# Patient Record
Sex: Female | Born: 1950 | Race: Black or African American | Hispanic: No | Marital: Married | Smoking: Never smoker
Health system: Southern US, Community
[De-identification: ages and names within clinical notes are randomized; demographics above are authoritative.]

## PROBLEM LIST (undated history)

## (undated) DIAGNOSIS — E559 Vitamin D deficiency, unspecified: Secondary | ICD-10-CM

## (undated) DIAGNOSIS — E785 Hyperlipidemia, unspecified: Secondary | ICD-10-CM

## (undated) DIAGNOSIS — M8580 Other specified disorders of bone density and structure, unspecified site: Secondary | ICD-10-CM

## (undated) DIAGNOSIS — I10 Essential (primary) hypertension: Secondary | ICD-10-CM

## (undated) DIAGNOSIS — J302 Other seasonal allergic rhinitis: Secondary | ICD-10-CM

## (undated) DIAGNOSIS — R05 Cough: Secondary | ICD-10-CM

## (undated) HISTORY — DX: Vitamin D deficiency, unspecified: E55.9

## (undated) HISTORY — PX: BREAST BIOPSY: SHX20

## (undated) HISTORY — PX: TUBAL LIGATION: SHX77

## (undated) HISTORY — DX: Hyperlipidemia, unspecified: E78.5

## (undated) HISTORY — DX: Essential (primary) hypertension: I10

## (undated) HISTORY — DX: Other specified disorders of bone density and structure, unspecified site: M85.80

## (undated) HISTORY — PX: ABDOMINAL HYSTERECTOMY: SHX81

## (undated) HISTORY — DX: Other seasonal allergic rhinitis: J30.2

---
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
## 1997-05-28 ENCOUNTER — Ambulatory Visit (HOSPITAL_COMMUNITY): Admission: RE | Admit: 1997-05-28 | Discharge: 1997-05-28 | Payer: Self-pay | Admitting: Internal Medicine

## 1997-09-12 ENCOUNTER — Ambulatory Visit (HOSPITAL_COMMUNITY): Admission: RE | Admit: 1997-09-12 | Discharge: 1997-09-12 | Payer: Self-pay | Admitting: Gastroenterology

## 1997-11-11 ENCOUNTER — Inpatient Hospital Stay (HOSPITAL_COMMUNITY): Admission: RE | Admit: 1997-11-11 | Discharge: 1997-11-12 | Payer: Self-pay | Admitting: Obstetrics and Gynecology

## 1998-08-22 ENCOUNTER — Encounter: Payer: Self-pay | Admitting: Obstetrics and Gynecology

## 1998-08-22 ENCOUNTER — Ambulatory Visit (HOSPITAL_COMMUNITY): Admission: RE | Admit: 1998-08-22 | Discharge: 1998-08-22 | Payer: Self-pay | Admitting: Obstetrics and Gynecology

## 1998-08-25 ENCOUNTER — Ambulatory Visit (HOSPITAL_COMMUNITY): Admission: RE | Admit: 1998-08-25 | Discharge: 1998-08-25 | Payer: Self-pay | Admitting: Obstetrics and Gynecology

## 1999-04-20 ENCOUNTER — Other Ambulatory Visit: Admission: RE | Admit: 1999-04-20 | Discharge: 1999-04-20 | Payer: Self-pay | Admitting: Obstetrics and Gynecology

## 1999-09-04 ENCOUNTER — Encounter: Payer: Self-pay | Admitting: Family Medicine

## 1999-09-04 ENCOUNTER — Encounter: Admission: RE | Admit: 1999-09-04 | Discharge: 1999-09-04 | Payer: Self-pay | Admitting: Family Medicine

## 1999-09-07 ENCOUNTER — Encounter: Payer: Self-pay | Admitting: Obstetrics and Gynecology

## 1999-09-07 ENCOUNTER — Ambulatory Visit (HOSPITAL_COMMUNITY): Admission: RE | Admit: 1999-09-07 | Discharge: 1999-09-07 | Payer: Self-pay | Admitting: Obstetrics and Gynecology

## 1999-09-11 ENCOUNTER — Encounter: Admission: RE | Admit: 1999-09-11 | Discharge: 1999-09-11 | Payer: Self-pay | Admitting: Obstetrics and Gynecology

## 1999-09-11 ENCOUNTER — Encounter: Payer: Self-pay | Admitting: Obstetrics and Gynecology

## 2000-05-03 ENCOUNTER — Ambulatory Visit (HOSPITAL_COMMUNITY): Admission: RE | Admit: 2000-05-03 | Discharge: 2000-05-03 | Payer: Self-pay | Admitting: Family Medicine

## 2000-05-03 ENCOUNTER — Encounter: Payer: Self-pay | Admitting: Family Medicine

## 2000-07-27 ENCOUNTER — Other Ambulatory Visit: Admission: RE | Admit: 2000-07-27 | Discharge: 2000-07-27 | Payer: Self-pay | Admitting: Obstetrics and Gynecology

## 2000-10-04 ENCOUNTER — Ambulatory Visit (HOSPITAL_COMMUNITY): Admission: RE | Admit: 2000-10-04 | Discharge: 2000-10-04 | Payer: Self-pay | Admitting: Obstetrics and Gynecology

## 2000-10-04 ENCOUNTER — Encounter: Payer: Self-pay | Admitting: Obstetrics and Gynecology

## 2002-02-08 HISTORY — PX: COLONOSCOPY: SHX174

## 2002-02-12 ENCOUNTER — Ambulatory Visit (HOSPITAL_COMMUNITY): Admission: RE | Admit: 2002-02-12 | Discharge: 2002-02-12 | Payer: Self-pay | Admitting: Gastroenterology

## 2002-10-30 ENCOUNTER — Ambulatory Visit (HOSPITAL_COMMUNITY): Admission: RE | Admit: 2002-10-30 | Discharge: 2002-10-30 | Payer: Self-pay | Admitting: Obstetrics and Gynecology

## 2002-10-30 ENCOUNTER — Encounter: Payer: Self-pay | Admitting: Obstetrics and Gynecology

## 2003-09-12 ENCOUNTER — Encounter: Admission: RE | Admit: 2003-09-12 | Discharge: 2003-09-12 | Payer: Self-pay | Admitting: Obstetrics and Gynecology

## 2004-03-19 ENCOUNTER — Other Ambulatory Visit: Admission: RE | Admit: 2004-03-19 | Discharge: 2004-03-19 | Payer: Self-pay | Admitting: Obstetrics and Gynecology

## 2005-05-19 ENCOUNTER — Ambulatory Visit (HOSPITAL_COMMUNITY): Admission: RE | Admit: 2005-05-19 | Discharge: 2005-05-19 | Payer: Self-pay | Admitting: Obstetrics and Gynecology

## 2005-12-29 ENCOUNTER — Other Ambulatory Visit: Admission: RE | Admit: 2005-12-29 | Discharge: 2005-12-29 | Payer: Self-pay | Admitting: Obstetrics and Gynecology

## 2006-01-11 ENCOUNTER — Ambulatory Visit (HOSPITAL_COMMUNITY): Admission: RE | Admit: 2006-01-11 | Discharge: 2006-01-11 | Payer: Self-pay | Admitting: Obstetrics and Gynecology

## 2009-07-01 ENCOUNTER — Ambulatory Visit (HOSPITAL_COMMUNITY): Admission: RE | Admit: 2009-07-01 | Discharge: 2009-07-01 | Payer: Self-pay | Admitting: Obstetrics and Gynecology

## 2010-06-26 NOTE — Op Note (Signed)
   NAME:  Kathleen Douglas, Kathleen Douglas NO.:  [id number]   MEDICAL RECORD NO.:  [id number]                   PATIENT TYPE:  AMB   LOCATION:  ENDO                                 FACILITY:  [facility]   PHYSICIAN:  Danise Edge, M.D.                DATE OF BIRTH:  [birth year]   DATE OF PROCEDURE:  02/12/2002  DATE OF DISCHARGE:                                 OPERATIVE REPORT   PROCEDURE PERFORMED:  Colonoscopy.   ENDOSCOPIST:  Charolett Bumpers, M.D.   INDICATIONS FOR PROCEDURE:  The patient is a 60 year old female born 1950/08/21.  The patient is undergoing a follow-up screening colonoscopy with  polypectomy to prevent colon cancer.  On September 12, 1997 she underwent a  colonoscopy; from the middistal sigmoid colon, two 1 mm sessile polyps were  removed.  Both polyps returned benign inflammatory polyps without neoplastic  tissue noted.   PREMEDICATION:  Versed 6 mg, Demerol 50 mg.   INSTRUMENT USED:  Pediatric Olympus video colonoscope.   DESCRIPTION OF PROCEDURE:  After obtaining informed consent, the patient was  placed in the left lateral decubitus position.  I administered intravenous  Demerol and intravenous Versed to achieve conscious sedation for the  procedure.  The patient's blood pressure, oxygen saturations and cardiac  rhythm were monitored throughout the procedure and documented in the medical  record.   Anal inspection was normal.  Digital rectal exam was normal.  The pediatric  Olympus video colonoscope was introduced into the rectum and easily advanced  to the cecum.  Colonic preparation for the exam today was excellent.   Rectum:  Normal.   Sigmoid colon and descending colon:  Normal.   Splenic flexure:  Normal.   Transverse colon:  Normal.   Hepatic flexure:  Normal.   Ascending colon:  Normal.   Cecum and ileocecal valve:  Normal.    ASSESSMENT:  Normal proctocolonoscopy to the cecum.  No endoscopic evidence  for the presence  of colorectal neoplasia.   RECOMMENDATIONS:  Repeat colonoscopy or virtual colonoscopy in approximately  10 years.                                                  Danise Edge, M.D.    MJ/MEDQ  D:  02/12/2002  T:  02/12/2002  Job:  [id number]   cc:   Janine Limbo, M.D.  [address], [address]  [place]  [place] [address]  Fax: [phone]   Duncan Dull, M.D.  [address]  [place]  [place] [address]  Fax: [phone]

## 2011-01-15 ENCOUNTER — Ambulatory Visit (INDEPENDENT_AMBULATORY_CARE_PROVIDER_SITE_OTHER): Payer: BC Managed Care – PPO

## 2011-01-15 DIAGNOSIS — D1739 Benign lipomatous neoplasm of skin and subcutaneous tissue of other sites: Secondary | ICD-10-CM

## 2011-01-15 DIAGNOSIS — R079 Chest pain, unspecified: Secondary | ICD-10-CM

## 2011-02-06 ENCOUNTER — Ambulatory Visit (INDEPENDENT_AMBULATORY_CARE_PROVIDER_SITE_OTHER): Payer: BC Managed Care – PPO

## 2011-02-06 DIAGNOSIS — M25519 Pain in unspecified shoulder: Secondary | ICD-10-CM

## 2011-02-06 DIAGNOSIS — M62838 Other muscle spasm: Secondary | ICD-10-CM

## 2011-02-09 ENCOUNTER — Ambulatory Visit (INDEPENDENT_AMBULATORY_CARE_PROVIDER_SITE_OTHER): Payer: BC Managed Care – PPO

## 2011-02-09 DIAGNOSIS — M549 Dorsalgia, unspecified: Secondary | ICD-10-CM

## 2011-02-09 DIAGNOSIS — IMO0002 Reserved for concepts with insufficient information to code with codable children: Secondary | ICD-10-CM

## 2011-02-09 DIAGNOSIS — M25519 Pain in unspecified shoulder: Secondary | ICD-10-CM

## 2011-06-10 ENCOUNTER — Other Ambulatory Visit: Payer: Self-pay | Admitting: Obstetrics and Gynecology

## 2011-06-10 DIAGNOSIS — Z1231 Encounter for screening mammogram for malignant neoplasm of breast: Secondary | ICD-10-CM

## 2011-06-14 ENCOUNTER — Ambulatory Visit (HOSPITAL_COMMUNITY)
Admission: RE | Admit: 2011-06-14 | Discharge: 2011-06-14 | Disposition: A | Discharge status: Home / Self Care | Payer: BC Managed Care – PPO | Source: Ambulatory Visit | Attending: Obstetrics and Gynecology | Admitting: Obstetrics and Gynecology

## 2011-06-14 DIAGNOSIS — Z1231 Encounter for screening mammogram for malignant neoplasm of breast: Secondary | ICD-10-CM | POA: Insufficient documentation

## 2011-07-13 ENCOUNTER — Encounter: Payer: Self-pay | Admitting: Obstetrics and Gynecology

## 2012-02-21 ENCOUNTER — Ambulatory Visit (INDEPENDENT_AMBULATORY_CARE_PROVIDER_SITE_OTHER): Payer: BC Managed Care – PPO | Admitting: Emergency Medicine

## 2012-02-21 VITALS — BP 139/84 | HR 75 | Temp 98.2°F | Resp 17 | Ht 63.0 in | Wt 148.0 lb

## 2012-02-21 DIAGNOSIS — Z23 Encounter for immunization: Secondary | ICD-10-CM

## 2012-02-21 DIAGNOSIS — Z0000 Encounter for general adult medical examination without abnormal findings: Secondary | ICD-10-CM

## 2012-02-21 DIAGNOSIS — Z1239 Encounter for other screening for malignant neoplasm of breast: Secondary | ICD-10-CM

## 2012-02-21 LAB — COMPREHENSIVE METABOLIC PANEL
ALT: 19 U/L (ref 0–35)
AST: 22 U/L (ref 0–37)
Albumin: 4.5 g/dL (ref 3.5–5.2)
Alkaline Phosphatase: 72 U/L (ref 39–117)
BUN: 20 mg/dL (ref 6–23)
CO2: 27 mEq/L (ref 19–32)
Calcium: 9.4 mg/dL (ref 8.4–10.5)
Chloride: 101 mEq/L (ref 96–112)
Creat: 0.64 mg/dL (ref 0.50–1.10)
Glucose, Bld: 85 mg/dL (ref 70–99)
Potassium: 3.9 mEq/L (ref 3.5–5.3)
Sodium: 140 mEq/L (ref 135–145)
Total Bilirubin: 0.7 mg/dL (ref 0.3–1.2)
Total Protein: 7.1 g/dL (ref 6.0–8.3)

## 2012-02-21 LAB — POCT CBC
Granulocyte percent: 52 %G (ref 37–80)
HCT, POC: 42.8 % (ref 37.7–47.9)
Hemoglobin: 12.8 g/dL (ref 12.2–16.2)
Lymph, poc: 1.7 (ref 0.6–3.4)
MCH, POC: 26.8 pg — AB (ref 27–31.2)
MCHC: 29.9 g/dL — AB (ref 31.8–35.4)
MCV: 89.8 fL (ref 80–97)
MID (cbc): 0.3 (ref 0–0.9)
MPV: 9.5 fL (ref 0–99.8)
POC Granulocyte: 2.1 (ref 2–6.9)
POC LYMPH PERCENT: 41.5 %L (ref 10–50)
POC MID %: 6.5 %M (ref 0–12)
Platelet Count, POC: 215 10*3/uL (ref 142–424)
RBC: 4.77 M/uL (ref 4.04–5.48)
RDW, POC: 14 %
WBC: 4 10*3/uL — AB (ref 4.6–10.2)

## 2012-02-21 LAB — LIPID PANEL
Cholesterol: 227 mg/dL — ABNORMAL HIGH (ref 0–200)
HDL: 105 mg/dL (ref >39)
LDL Cholesterol: 112 mg/dL — ABNORMAL HIGH (ref 0–99)
Total CHOL/HDL Ratio: 2.2 Ratio
Triglycerides: 51 mg/dL (ref <150)
VLDL: 10 mg/dL (ref 0–40)

## 2012-02-21 LAB — POCT UA - MICROSCOPIC ONLY
Bacteria, U Microscopic: NEGATIVE
Casts, Ur, LPF, POC: NEGATIVE
Crystals, Ur, HPF, POC: NEGATIVE
Mucus, UA: NEGATIVE
WBC, Ur, HPF, POC: NEGATIVE
Yeast, UA: NEGATIVE

## 2012-02-21 LAB — TSH: TSH: 0.799 u[IU]/mL (ref 0.350–4.500)

## 2012-02-21 LAB — POCT URINALYSIS DIPSTICK
Bilirubin, UA: NEGATIVE
Glucose, UA: NEGATIVE
Ketones, UA: NEGATIVE
Leukocytes, UA: NEGATIVE
Nitrite, UA: NEGATIVE
Spec Grav, UA: 1.025
Urobilinogen, UA: 1
pH, UA: 6

## 2012-02-21 MED ORDER — INFLUENZA VIRUS VACCINE SPLIT IM INJ: 0.5000 mL | INJECTION | Freq: Once | INTRAMUSCULAR | Status: DC

## 2012-02-21 NOTE — Progress Notes (Signed)
[facility] [address], [place] [address] [phone]  Date:  02/21/2012   Name:  Kathleen Douglas   DOB:  1950/02/09   MRN:  [id number]  PCP:  No primary provider on file.    Chief Complaint: Annual Exam   History of Present Illness:  Kathleen Douglas is a 62 y.o. very pleasant female patient who presents with the following:  For a wellness exam.  Not up to date on immunizations and has no plan on updating them.  Never has had a flu shot.  Works part time in [employer].  No medications and does not take aspirin.  26 pound weight loss and has gained some weight back.  Currently trying to reach her lower weight.  Some pain and swelling in right knee that interfered with walking.  Responded to rest and now no complaints.  PAP 2 years ago.  Hysterectomy and BSO.  Mammogram 1 year ago and was normal.  There is no problem list on file for this patient.   History reviewed. No pertinent past medical history.  Past Surgical History  Procedure Date  . Abdominal hysterectomy   . Tubal ligation     History  Substance Use Topics  . Smoking status: Never Smoker   . Smokeless tobacco: Not on file  . Alcohol Use: No    Family History  Problem Relation Age of Onset  . Lymphoma Mother   . Cancer Father   . Hypertension Sister   . Hypertension Daughter   . Cancer Maternal Grandmother   . Diabetes Maternal Grandfather   . Cancer Paternal Grandmother     No Known Allergies  Medication list has been reviewed and updated.  Current Outpatient Prescriptions on File Prior to Visit  Medication Sig Dispense Refill  . Calcium Carbonate-Vitamin D (CALCIUM + D PO) Take by mouth.        Review of Systems:  As per HPI, otherwise negative.    Physical Examination: Filed Vitals:   02/21/12 0909  BP: 139/84  Pulse: 75  Temp: 98.2 F (36.8 C)  Resp: 17   Filed Vitals:   02/21/12 0909  Height: 5\' 3"  (1.6 m)  Weight: 148 lb (67.132 kg)   Body mass index is  26.22 kg/(m^2). Ideal Body Weight: Weight in (lb) to have BMI = 25: 140.8   GEN: WDWN, NAD, Non-toxic, A & O x 3 HEENT: Atraumatic, Normocephalic. Neck supple. No masses, No LAD. Ears and Nose: No external deformity. CV: RRR, No M/G/R. No JVD. No thrill. No extra heart sounds. PULM: CTA B, no wheezes, crackles, rhonchi. No retractions. No resp. distress. No accessory muscle use. ABD: S, NT, ND, +BS. No rebound. No HSM. EXTR: No c/c/e NEURO Normal gait.  PSYCH: Normally interactive. Conversant. Not depressed or anxious appearing.  Calm demeanor.  RECTAL:  Digital rectal normal  Assessment and Plan: Annual wellness exam. Suggested ASA daily Continue weight loss FLU and pneumovax Colonoscopy per scheduled appt.  Carmelina Dane, MD

## 2012-02-22 LAB — VITAMIN D 25 HYDROXY (VIT D DEFICIENCY, FRACTURES): Vit D, 25-Hydroxy: 27 ng/mL — ABNORMAL LOW (ref 30–89)

## 2012-02-22 NOTE — Progress Notes (Signed)
Pt contacted our office and requested that we mail her another copy of her AVS from yesterday's OV. I have printed off another copy and mailed it to her address in [place].

## 2012-02-24 ENCOUNTER — Telehealth: Payer: Self-pay | Admitting: Radiology

## 2012-02-24 NOTE — Telephone Encounter (Signed)
Can you please review labs, I can call patient with results, she is anxious for the results. Kathleen Douglas

## 2012-02-25 MED ORDER — ATORVASTATIN CALCIUM 40 MG PO TABS: 40.0000 mg | ORAL_TABLET | Freq: Every day | ORAL | Status: DC

## 2012-02-25 MED ORDER — VITAMIN D (ERGOCALCIFEROL) 1.25 MG (50000 UNIT) PO CAPS: 50000.0000 [IU] | ORAL_CAPSULE | ORAL | Status: DC

## 2012-02-25 NOTE — Telephone Encounter (Signed)
Tell him everything looks good follow up in 1 year

## 2012-02-25 NOTE — Telephone Encounter (Signed)
Called her (female) to advise on lab results. Left message for her to call if she has any questions.

## 2012-02-28 ENCOUNTER — Telehealth: Payer: Self-pay

## 2012-02-28 NOTE — Telephone Encounter (Signed)
Patient received results in mail today and wants to go over them with someone to understand them better. Thank you! Best number is [phone].

## 2012-03-01 MED ORDER — VITAMIN D (ERGOCALCIFEROL) 1.25 MG (50000 UNIT) PO CAPS: 50000.0000 [IU] | ORAL_CAPSULE | ORAL | Status: DC

## 2012-03-01 MED ORDER — ATORVASTATIN CALCIUM 40 MG PO TABS: 40.0000 mg | ORAL_TABLET | Freq: Every day | ORAL | Status: DC

## 2012-03-01 NOTE — Telephone Encounter (Signed)
Pt called back to ask if she can continue taking her calcium w/vit D along w/her Rx vit D and I advised her that this should be fine since there is not that high a dose of the vit D in her calcium supplement. Also advised pt that she will absorb the vit D better if taken w/orange juice. Pt requested that both of her Rxs be changed to 90 day supplies for cost purposes. I have changed the Rxs to 90 day supplies.

## 2012-03-02 ENCOUNTER — Telehealth: Payer: Self-pay

## 2012-03-02 MED ORDER — ATORVASTATIN CALCIUM 40 MG PO TABS: 40.0000 mg | ORAL_TABLET | Freq: Every day | ORAL | Status: DC

## 2012-03-02 NOTE — Telephone Encounter (Signed)
[facility] Rx sent for 90 days.

## 2012-03-02 NOTE — Telephone Encounter (Signed)
He just had an office visit and #90 was sent.  I just resent #90.

## 2012-03-02 NOTE — Telephone Encounter (Signed)
PATIENT STATES SHE CALLED AND REQUESTED 90 SUPPLY OF HER MEDICATIONS.  HER HUSBAND WILL BE LOSING HIS INSURANCE AT THE END OF January.  She went to the pharmacy and got the 30 day supply, not realizing it was not what she asked for.    [phone]

## 2012-03-02 NOTE — Telephone Encounter (Signed)
Can we change, im guessing it was because pt needs office visit

## 2012-03-03 ENCOUNTER — Telehealth: Payer: Self-pay

## 2012-03-03 NOTE — Telephone Encounter (Signed)
SPoke with pt, advised RX was for #90. Called pharmacy to verify. Pt understood.

## 2012-03-03 NOTE — Telephone Encounter (Signed)
Pt says her rx that was just called in to the pharmacy was supposed to be for 90 days not 60 she says it is critical that is 90 days. Please call her at [phone]

## 2012-06-27 ENCOUNTER — Ambulatory Visit (HOSPITAL_COMMUNITY): Payer: BC Managed Care – PPO | Attending: Emergency Medicine

## 2012-08-27 ENCOUNTER — Other Ambulatory Visit: Payer: Self-pay | Admitting: Emergency Medicine

## 2012-09-15 ENCOUNTER — Ambulatory Visit (HOSPITAL_COMMUNITY)
Admission: RE | Admit: 2012-09-15 | Discharge: 2012-09-15 | Disposition: A | Discharge status: Home / Self Care | Payer: No Typology Code available for payment source | Source: Ambulatory Visit | Attending: Emergency Medicine | Admitting: Emergency Medicine

## 2012-09-15 DIAGNOSIS — Z1239 Encounter for other screening for malignant neoplasm of breast: Secondary | ICD-10-CM

## 2012-09-15 DIAGNOSIS — Z1231 Encounter for screening mammogram for malignant neoplasm of breast: Secondary | ICD-10-CM | POA: Insufficient documentation

## 2013-09-22 ENCOUNTER — Encounter (HOSPITAL_COMMUNITY): Payer: Self-pay | Admitting: Emergency Medicine

## 2013-09-22 ENCOUNTER — Emergency Department (HOSPITAL_COMMUNITY)
Admission: EM | Admit: 2013-09-22 | Discharge: 2013-09-22 | Disposition: A | Discharge status: Home / Self Care | Payer: No Typology Code available for payment source | Attending: Emergency Medicine | Admitting: Emergency Medicine

## 2013-09-22 ENCOUNTER — Emergency Department (HOSPITAL_COMMUNITY): Payer: No Typology Code available for payment source

## 2013-09-22 DIAGNOSIS — T148XXA Other injury of unspecified body region, initial encounter: Secondary | ICD-10-CM

## 2013-09-22 DIAGNOSIS — S060X0A Concussion without loss of consciousness, initial encounter: Secondary | ICD-10-CM | POA: Diagnosis not present

## 2013-09-22 DIAGNOSIS — Z23 Encounter for immunization: Secondary | ICD-10-CM | POA: Diagnosis not present

## 2013-09-22 DIAGNOSIS — S0083XA Contusion of other part of head, initial encounter: Principal | ICD-10-CM | POA: Insufficient documentation

## 2013-09-22 DIAGNOSIS — Y9289 Other specified places as the place of occurrence of the external cause: Secondary | ICD-10-CM | POA: Insufficient documentation

## 2013-09-22 DIAGNOSIS — S0990XA Unspecified injury of head, initial encounter: Secondary | ICD-10-CM | POA: Diagnosis present

## 2013-09-22 DIAGNOSIS — S0003XA Contusion of scalp, initial encounter: Secondary | ICD-10-CM | POA: Diagnosis not present

## 2013-09-22 DIAGNOSIS — S1093XA Contusion of unspecified part of neck, initial encounter: Secondary | ICD-10-CM | POA: Insufficient documentation

## 2013-09-22 DIAGNOSIS — S0993XA Unspecified injury of face, initial encounter: Secondary | ICD-10-CM | POA: Insufficient documentation

## 2013-09-22 DIAGNOSIS — Z79899 Other long term (current) drug therapy: Secondary | ICD-10-CM | POA: Insufficient documentation

## 2013-09-22 DIAGNOSIS — S199XXA Unspecified injury of neck, initial encounter: Secondary | ICD-10-CM

## 2013-09-22 DIAGNOSIS — R42 Dizziness and giddiness: Secondary | ICD-10-CM | POA: Insufficient documentation

## 2013-09-22 DIAGNOSIS — Y9301 Activity, walking, marching and hiking: Secondary | ICD-10-CM | POA: Insufficient documentation

## 2013-09-22 DIAGNOSIS — IMO0002 Reserved for concepts with insufficient information to code with codable children: Secondary | ICD-10-CM | POA: Diagnosis not present

## 2013-09-22 MED ORDER — MECLIZINE HCL 25 MG PO TABS
25.0000 mg | ORAL_TABLET | Freq: Once | ORAL | Status: AC
  Administered 2013-09-22: 25 mg via ORAL
  Filled 2013-09-22: qty 1

## 2013-09-22 MED ORDER — TETANUS-DIPHTH-ACELL PERTUSSIS 5-2.5-18.5 LF-MCG/0.5 IM SUSP
0.5000 mL | Freq: Once | INTRAMUSCULAR | Status: AC
  Administered 2013-09-22: 0.5 mL via INTRAMUSCULAR
  Filled 2013-09-22: qty 0.5

## 2013-09-22 MED ORDER — ONDANSETRON 4 MG PO TBDP
4.0000 mg | ORAL_TABLET | Freq: Once | ORAL | Status: AC
  Administered 2013-09-22: 4 mg via ORAL
  Filled 2013-09-22: qty 1

## 2013-09-22 MED ORDER — MECLIZINE HCL 25 MG PO TABS: 25.0000 mg | ORAL_TABLET | Freq: Three times a day (TID) | ORAL | Status: DC | PRN

## 2013-09-22 MED ORDER — HYDROCODONE-ACETAMINOPHEN 5-325 MG PO TABS: 1.0000 | ORAL_TABLET | Freq: Four times a day (QID) | ORAL | Status: DC | PRN

## 2013-09-22 MED ORDER — ONDANSETRON 4 MG PO TBDP: 4.0000 mg | ORAL_TABLET | Freq: Three times a day (TID) | ORAL | Status: DC | PRN

## 2013-09-22 NOTE — Discharge Instructions (Signed)
Abrasion °An abrasion is a cut or scrape of the skin. Abrasions do not extend through all layers of the skin and most heal within 10 days. It is important to care for your abrasion properly to prevent infection. °CAUSES  °Most abrasions are caused by falling on, or gliding across, the ground or other surface. When your skin rubs on something, the outer and inner layer of skin rubs off, causing an abrasion. °DIAGNOSIS  °Your caregiver will be able to diagnose an abrasion during a physical exam.  °TREATMENT  °Your treatment depends on how large and deep the abrasion is. Generally, your abrasion will be cleaned with water and a mild soap to remove any dirt or debris. An antibiotic ointment may be put over the abrasion to prevent an infection. A bandage (dressing) may be wrapped around the abrasion to keep it from getting dirty.  °You may need a tetanus shot if: °· You cannot remember when you had your last tetanus shot. °· You have never had a tetanus shot. °· The injury broke your skin. °If you get a tetanus shot, your arm may swell, get red, and feel warm to the touch. This is common and not a problem. If you need a tetanus shot and you choose not to have one, there is a rare chance of getting tetanus. Sickness from tetanus can be serious.  °HOME CARE INSTRUCTIONS  °· If a dressing was applied, change it at least once a day or as directed by your caregiver. If the bandage sticks, soak it off with warm water.   °· Wash the area with water and a mild soap to remove all the ointment 2 times a day. Rinse off the soap and pat the area dry with a clean towel.   °· Reapply any ointment as directed by your caregiver. This will help prevent infection and keep the bandage from sticking. Use gauze over the wound and under the dressing to help keep the bandage from sticking.   °· Change your dressing right away if it becomes wet or dirty.   °· Only take over-the-counter or prescription medicines for pain, discomfort, or fever as  directed by your caregiver.   °· Follow up with your caregiver within 24-48 hours for a wound check, or as directed. If you were not given a wound-check appointment, look closely at your abrasion for redness, swelling, or pus. These are signs of infection. °SEEK IMMEDIATE MEDICAL CARE IF:  °· You have increasing pain in the wound.   °· You have redness, swelling, or tenderness around the wound.   °· You have pus coming from the wound.   °· You have a fever or persistent symptoms for more than 2-3 days. °· You have a fever and your symptoms suddenly get worse. °· You have a bad smell coming from the wound or dressing.   °MAKE SURE YOU:  °· Understand these instructions. °· Will watch your condition. °· Will get help right away if you are not doing well or get worse. °Document Released: 11/04/2004 Document Revised: 01/12/2012 Document Reviewed: 12/29/2010 °ExitCare® Patient Information ©2015 [employer]. This information is not intended to replace advice given to you by your health care provider. Make sure you discuss any questions you have with your health care provider. ° °Concussion °A concussion, or closed-head injury, is a brain injury caused by a direct blow to the head or by a quick and sudden movement (jolt) of the head or neck. Concussions are usually not life-threatening. Even so, the effects of a concussion can be   serious. If you have had a concussion before, you are more likely to experience concussion-like symptoms after a direct blow to the head.  °CAUSES °· Direct blow to the head, such as from running into another player during a soccer game, being hit in a fight, or hitting your head on a hard surface. °· A jolt of the head or neck that causes the brain to move back and forth inside the skull, such as in a car crash. °SIGNS AND SYMPTOMS °The signs of a concussion can be hard to notice. Early on, they may be missed by you, family members, and health care providers. You may look fine but act or feel  differently. °Symptoms are usually temporary, but they may last for days, weeks, or even longer. Some symptoms may appear right away while others may not show up for hours or days. Every head injury is different. Symptoms include: °· Mild to moderate headaches that will not go away. °· A feeling of pressure inside your head. °· Having more trouble than usual: °¨ Learning or remembering things you have heard. °¨ Answering questions. °¨ Paying attention or concentrating. °¨ Organizing daily tasks. °¨ Making decisions and solving problems. °· Slowness in thinking, acting or reacting, speaking, or reading. °· Getting lost or being easily confused. °· Feeling tired all the time or lacking energy (fatigued). °· Feeling drowsy. °· Sleep disturbances. °¨ Sleeping more than usual. °¨ Sleeping less than usual. °¨ Trouble falling asleep. °¨ Trouble sleeping (insomnia). °· Loss of balance or feeling lightheaded or dizzy. °· Nausea or vomiting. °· Numbness or tingling. °· Increased sensitivity to: °¨ Sounds. °¨ Lights. °¨ Distractions. °· Vision problems or eyes that tire easily. °· Diminished sense of taste or smell. °· Ringing in the ears. °· Mood changes such as feeling sad or anxious. °· Becoming easily irritated or angry for little or no reason. °· Lack of motivation. °· Seeing or hearing things other people do not see or hear (hallucinations). °DIAGNOSIS °Your health care provider can usually diagnose a concussion based on a description of your injury and symptoms. He or she will ask whether you passed out (lost consciousness) and whether you are having trouble remembering events that happened right before and during your injury. °Your evaluation might include: °· A brain scan to look for signs of injury to the brain. Even if the test shows no injury, you may still have a concussion. °· Blood tests to be sure other problems are not present. °TREATMENT °· Concussions are usually treated in an emergency department, in urgent  care, or at a clinic. You may need to stay in the hospital overnight for further treatment. °· Tell your health care provider if you are taking any medicines, including prescription medicines, over-the-counter medicines, and natural remedies. Some medicines, such as blood thinners (anticoagulants) and aspirin, may increase the chance of complications. Also tell your health care provider whether you have had alcohol or are taking illegal drugs. This information may affect treatment. °· Your health care provider will send you home with important instructions to follow. °· How fast you will recover from a concussion depends on many factors. These factors include how severe your concussion is, what part of your brain was injured, your age, and how healthy you were before the concussion. °· Most people with mild injuries recover fully. Recovery can take time. In general, recovery is slower in older persons. Also, persons who have had a concussion in the past or have other medical problems   may find that it takes longer to recover from their current injury. °HOME CARE INSTRUCTIONS °General Instructions °· Carefully follow the directions your health care provider gave you. °· Only take over-the-counter or prescription medicines for pain, discomfort, or fever as directed by your health care provider. °· Take only those medicines that your health care provider has approved. °· Do not drink alcohol until your health care provider says you are well enough to do so. Alcohol and certain other drugs may slow your recovery and can put you at risk of further injury. °· If it is harder than usual to remember things, write them down. °· If you are easily distracted, try to do one thing at a time. For example, do not try to watch TV while fixing dinner. °· Talk with family members or close friends when making important decisions. °· Keep all follow-up appointments. Repeated evaluation of your symptoms is recommended for your  recovery. °· Watch your symptoms and tell others to do the same. Complications sometimes occur after a concussion. Older adults with a brain injury may have a higher risk of serious complications, such as a blood clot on the brain. °· Tell your teachers, school nurse, school counselor, coach, athletic trainer, or work manager about your injury, symptoms, and restrictions. Tell them about what you can or cannot do. They should watch for: °¨ Increased problems with attention or concentration. °¨ Increased difficulty remembering or learning new information. °¨ Increased time needed to complete tasks or assignments. °¨ Increased irritability or decreased ability to cope with stress. °¨ Increased symptoms. °· Rest. Rest helps the brain to heal. Make sure you: °¨ Get plenty of sleep at night. Avoid staying up late at night. °¨ Keep the same bedtime hours on weekends and weekdays. °¨ Rest during the day. Take daytime naps or rest breaks when you feel tired. °· Limit activities that require a lot of thought or concentration. These include: °¨ Doing homework or job-related work. °¨ Watching TV. °¨ Working on the computer. °· Avoid any situation where there is potential for another head injury (football, hockey, soccer, basketball, martial arts, downhill snow sports and horseback riding). Your condition will get worse every time you experience a concussion. You should avoid these activities until you are evaluated by the appropriate follow-up health care providers. °Returning To Your Regular Activities °You will need to return to your normal activities slowly, not all at once. You must give your body and brain enough time for recovery. °· Do not return to sports or other athletic activities until your health care provider tells you it is safe to do so. °· Ask your health care provider when you can drive, ride a bicycle, or operate heavy machinery. Your ability to react may be slower after a brain injury. Never do these  activities if you are dizzy. °· Ask your health care provider about when you can return to work or school. °Preventing Another Concussion °It is very important to avoid another brain injury, especially before you have recovered. In rare cases, another injury can lead to permanent brain damage, brain swelling, or death. The risk of this is greatest during the first 7-10 days after a head injury. Avoid injuries by: °· Wearing a seat belt when riding in a car. °· Drinking alcohol only in moderation. °· Wearing a helmet when biking, skiing, skateboarding, skating, or doing similar activities. °· Avoiding activities that could lead to a second concussion, such as contact or recreational sports, until your health   care provider says it is okay. °· Taking safety measures in your home. °¨ Remove clutter and tripping hazards from floors and stairways. °¨ Use grab bars in bathrooms and handrails by stairs. °¨ Place non-slip mats on floors and in bathtubs. °¨ Improve lighting in dim areas. °SEEK MEDICAL CARE IF: °· You have increased problems paying attention or concentrating. °· You have increased difficulty remembering or learning new information. °· You need more time to complete tasks or assignments than before. °· You have increased irritability or decreased ability to cope with stress. °· You have more symptoms than before. °Seek medical care if you have any of the following symptoms for more than 2 weeks after your injury: °· Lasting (chronic) headaches. °· Dizziness or balance problems. °· Nausea. °· Vision problems. °· Increased sensitivity to noise or light. °· Depression or mood swings. °· Anxiety or irritability. °· Memory problems. °· Difficulty concentrating or paying attention. °· Sleep problems. °· Feeling tired all the time. °SEEK IMMEDIATE MEDICAL CARE IF: °· You have severe or worsening headaches. These may be a sign of a blood clot in the brain. °· You have weakness (even if only in one hand, leg, or part of  the face). °· You have numbness. °· You have decreased coordination. °· You vomit repeatedly. °· You have increased sleepiness. °· One pupil is larger than the other. °· You have convulsions. °· You have slurred speech. °· You have increased confusion. This may be a sign of a blood clot in the brain. °· You have increased restlessness, agitation, or irritability. °· You are unable to recognize people or places. °· You have neck pain. °· It is difficult to wake you up. °· You have unusual behavior changes. °· You lose consciousness. °MAKE SURE YOU: °· Understand these instructions. °· Will watch your condition. °· Will get help right away if you are not doing well or get worse. °Document Released: 04/17/2003 Document Revised: 01/30/2013 Document Reviewed: 08/17/2012 °ExitCare® Patient Information ©2015 [employer]. This information is not intended to replace advice given to you by your health care provider. Make sure you discuss any questions you have with your health care provider. ° °

## 2013-09-22 NOTE — ED Notes (Signed)
Dr. Horton at bedside. 
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
## 2013-09-22 NOTE — ED Notes (Signed)
Vital signs stable. 
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
## 2013-09-22 NOTE — ED Notes (Signed)
IV D/C

## 2014-12-30 ENCOUNTER — Ambulatory Visit (INDEPENDENT_AMBULATORY_CARE_PROVIDER_SITE_OTHER): Payer: Medicare Other | Admitting: Family Medicine

## 2014-12-30 ENCOUNTER — Ambulatory Visit (INDEPENDENT_AMBULATORY_CARE_PROVIDER_SITE_OTHER): Payer: Medicare Other

## 2014-12-30 VITALS — BP 118/80 | HR 74 | Temp 98.2°F | Resp 16 | Ht 64.0 in | Wt 152.0 lb

## 2014-12-30 DIAGNOSIS — N201 Calculus of ureter: Secondary | ICD-10-CM | POA: Diagnosis not present

## 2014-12-30 DIAGNOSIS — R1032 Left lower quadrant pain: Secondary | ICD-10-CM

## 2014-12-30 DIAGNOSIS — R350 Frequency of micturition: Secondary | ICD-10-CM

## 2014-12-30 DIAGNOSIS — Z23 Encounter for immunization: Secondary | ICD-10-CM

## 2014-12-30 LAB — POCT CBC
Granulocyte percent: 54 %G (ref 37–80)
HCT, POC: 38.4 % (ref 37.7–47.9)
Hemoglobin: 13 g/dL (ref 12.2–16.2)
Lymph, poc: 1.5 (ref 0.6–3.4)
MCH, POC: 28.9 pg (ref 27–31.2)
MCHC: 33.8 g/dL (ref 31.8–35.4)
MCV: 85.6 fL (ref 80–97)
MID (cbc): 0.2 (ref 0–0.9)
MPV: 8.3 fL (ref 0–99.8)
POC Granulocyte: 2 (ref 2–6.9)
POC LYMPH PERCENT: 39.6 %L (ref 10–50)
POC MID %: 6.4 %M (ref 0–12)
Platelet Count, POC: 181 10*3/uL (ref 142–424)
RBC: 4.48 M/uL (ref 4.04–5.48)
RDW, POC: 14.4 %
WBC: 3.7 10*3/uL — AB (ref 4.6–10.2)

## 2014-12-30 LAB — COMPREHENSIVE METABOLIC PANEL
ALT: 16 U/L (ref 6–29)
AST: 18 U/L (ref 10–35)
Albumin: 4.1 g/dL (ref 3.6–5.1)
Alkaline Phosphatase: 75 U/L (ref 33–130)
BUN: 17 mg/dL (ref 7–25)
CO2: 29 mmol/L (ref 20–31)
Calcium: 9.8 mg/dL (ref 8.6–10.4)
Chloride: 104 mmol/L (ref 98–110)
Creat: 0.62 mg/dL (ref 0.50–0.99)
Glucose, Bld: 68 mg/dL (ref 65–99)
Potassium: 4.1 mmol/L (ref 3.5–5.3)
Sodium: 141 mmol/L (ref 135–146)
Total Bilirubin: 0.7 mg/dL (ref 0.2–1.2)
Total Protein: 6.8 g/dL (ref 6.1–8.1)

## 2014-12-30 LAB — POCT URINALYSIS DIP (MANUAL ENTRY)
Bilirubin, UA: NEGATIVE
Glucose, UA: NEGATIVE
Ketones, POC UA: NEGATIVE
Leukocytes, UA: NEGATIVE
Nitrite, UA: NEGATIVE
Protein Ur, POC: NEGATIVE
Spec Grav, UA: >=1.03
Urobilinogen, UA: 0.2
pH, UA: 6

## 2014-12-30 LAB — POC MICROSCOPIC URINALYSIS (UMFC): Mucus: ABSENT

## 2014-12-30 MED ORDER — CEPHALEXIN 500 MG PO CAPS: 500.0000 mg | ORAL_CAPSULE | Freq: Two times a day (BID) | ORAL | Status: DC

## 2014-12-30 NOTE — Patient Instructions (Addendum)
I will be in touch with the rest of your labs asap We are going to use keflex to treat any UTI that may be present I have some other labs pending to look for anything else of concern You might also try some miralax (OTC) 1-2 doses a day to relieve constipation   Please let me know if you are getting worse or have any change in your symptoms.

## 2014-12-30 NOTE — Progress Notes (Addendum)
[facility] [address], [place] [address] [phone]  Date:  12/30/2014   Name:  Kathleen Douglas   DOB:  1951/01/09   MRN:  [id number]  PCP:  No PCP Per Patient    Chief Complaint: Abdominal Pain and Urinary Frequency   History of Present Illness:  Kathleen Douglas is a 64 y.o. very pleasant female patient who presents with the following:  Here today with possible UTI Generally healthy, she is s/p hysterectomy Never a smoker  She has noticed sx of left lower quadrant pain for about 4 days. She also notes urinary frequency. She has pain if she tries to lift, walk, etc.  Ok at rest.  She is able to eat normally No vomiting, she has noted some of mild diarrhea / constipation.  She will feel like she still needs to go after having a BM No fever She has never had a kidney stone No known injury She generally does a lot of activity- she exercises, walks, does yoga  Last week her husband had a UTI- he was treated at [facility]. She wonders if she could have caught his infection. Discussed STI testing but she does not have any concern in this regard  There are no active problems to display for this patient.   History reviewed. No pertinent past medical history.  Past Surgical History  Procedure Laterality Date  . Abdominal hysterectomy    . Tubal ligation      Social History  Substance Use Topics  . Smoking status: Never Smoker   . Smokeless tobacco: None  . Alcohol Use: No     Comment: socially    Family History  Problem Relation Age of Onset  . Lymphoma Mother   . Cancer Father   . Hypertension Sister   . Hypertension Daughter   . Cancer Maternal Grandmother   . Diabetes Maternal Grandfather   . Cancer Paternal Grandmother     No Known Allergies  Medication list has been reviewed and updated.  Current Outpatient Prescriptions on File Prior to Visit  Medication Sig Dispense Refill  . Calcium Carbonate-Vitamin D (CALCIUM + D PO) Take 2  tablets by mouth daily.     Marland Kitchen HYDROcodone-acetaminophen (NORCO/VICODIN) 5-325 MG per tablet Take 1 tablet by mouth every 6 (six) hours as needed for moderate pain or severe pain. (Patient not taking: Reported on 12/30/2014) 15 tablet 0  . meclizine (ANTIVERT) 25 MG tablet Take 1 tablet (25 mg total) by mouth 3 (three) times daily as needed for dizziness. (Patient not taking: Reported on 12/30/2014) 30 tablet 0   No current facility-administered medications on file prior to visit.    Review of Systems:  As per HPI- otherwise negative.   Physical Examination: Filed Vitals:   12/30/14 1216  BP: 118/80  Pulse: 74  Temp: 98.2 F (36.8 C)  Resp: 16   Filed Vitals:   12/30/14 1216  Height: 5\' 4"  (1.626 m)  Weight: 152 lb (68.947 kg)   Body mass index is 26.08 kg/(m^2). Ideal Body Weight: Weight in (lb) to have BMI = 25: 145.3  GEN: WDWN, NAD, Non-toxic, A & O x 3, looks well HEENT: Atraumatic, Normocephalic. Neck supple. No masses, No LAD. Ears and Nose: No external deformity. CV: RRR, No M/G/R. No JVD. No thrill. No extra heart sounds. PULM: CTA B, no wheezes, crackles, rhonchi. No retractions. No resp. distress. No accessory muscle use. ABD: S, NT, ND, +BS. No rebound. No HSM.  Belly  is benign, no CVA tenderness EXTR: No c/c/e NEURO Normal gait.  PSYCH: Normally interactive. Conversant. Not depressed or anxious appearing.  Calm demeanor.    [facility] reading (PRIMARY) by  Dr. Lorelei Pont: KUB: increased stool burden in rectum ABDOMEN - 1 VIEW  COMPARISON: No priors.  FINDINGS: Gas and stool are seen scattered throughout the colon extending to the level of the distal rectum. No pathologic distension of small bowel is noted. No gross evidence of pneumoperitoneum. Multiple pelvic phleboliths. No definite calculi are noted projecting over either kidney. 2 mm calcification in the left hemipelvis may represent a ureteral stone.  IMPRESSION: 1. 2 mm calcification projecting over  the left side of the pelvis could represent a ureteral stone. This may alternatively simply represent an additional pelvic phlebolith. If there is clinical concern for a ureteral stone, followup evaluation with noncontrast CT the abdomen and pelvis would be recommended. 2. Nonobstructive bowel gas pattern. 3. No pneumoperitoneum.  Assessment and Plan: Frequent urination - Plan: POCT Microscopic Urinalysis ([facility]), POCT urinalysis dipstick, Urine culture, DG Abd 1 View, cephALEXin (KEFLEX) 500 MG capsule  LLQ pain - Plan: POCT CBC, Comprehensive metabolic panel, DG Abd 1 View, CANCELED: GC/Chlamydia Probe Amp  Needs flu shot - Plan: Flu Vaccine QUAD 36+ mos IM  Here today with LLQ discomfort and urinary sx.  Her sx and CBC do not suggest diverticulitis. Discussed a CT scan to absolutely rule this out, vs possibility of a kidney stone.  Also suspect some element of constipation.  Will start keflex, await urine culture and the rest of her labs.   Signed Lamar Blinks, MD  Called with KUB report as above and LMOM.  Will discuss with her when the rest of her labs come in  Results for orders placed or performed in visit on 12/30/14  Urine culture  Result Value Ref Range   Colony Count NO GROWTH    Organism ID, Bacteria NO GROWTH   Comprehensive metabolic panel  Result Value Ref Range   Sodium 141 135 - 146 mmol/L   Potassium 4.1 3.5 - 5.3 mmol/L   Chloride 104 98 - 110 mmol/L   CO2 29 20 - 31 mmol/L   Glucose, Bld 68 65 - 99 mg/dL   BUN 17 7 - 25 mg/dL   Creat 0.62 0.50 - 0.99 mg/dL   Total Bilirubin 0.7 0.2 - 1.2 mg/dL   Alkaline Phosphatase 75 33 - 130 U/L   AST 18 10 - 35 U/L   ALT 16 6 - 29 U/L   Total Protein 6.8 6.1 - 8.1 g/dL   Albumin 4.1 3.6 - 5.1 g/dL   Calcium 9.8 8.6 - 10.4 mg/dL  POCT Microscopic Urinalysis ([facility])  Result Value Ref Range   WBC,UR,HPF,POC Few (A) None WBC/hpf   RBC,UR,HPF,POC Few (A) None RBC/hpf   Bacteria Many (A) None, Too numerous to count    Mucus Absent Absent   Epithelial Cells, UR Per Microscopy Many (A) None, Too numerous to count cells/hpf  POCT urinalysis dipstick  Result Value Ref Range   Color, UA yellow yellow   Clarity, UA cloudy (A) clear   Glucose, UA negative negative   Bilirubin, UA negative negative   Ketones, POC UA negative negative   Spec Grav, UA >=1.030    Blood, UA small (A) negative   pH, UA 6.0    Protein Ur, POC negative negative   Urobilinogen, UA 0.2    Nitrite, UA Negative Negative   Leukocytes, UA Negative Negative  POCT CBC  Result Value Ref Range   WBC 3.7 (A) 4.6 - 10.2 K/uL   Lymph, poc 1.5 0.6 - 3.4   POC LYMPH PERCENT 39.6 10 - 50 %L   MID (cbc) 0.2 0 - 0.9   POC MID % 6.4 0 - 12 %M   POC Granulocyte 2.0 2 - 6.9   Granulocyte percent 54.0 37 - 80 %G   RBC 4.48 4.04 - 5.48 M/uL   Hemoglobin 13.0 12.2 - 16.2 g/dL   HCT, POC 38.4 37.7 - 47.9 %   MCV 85.6 80 - 97 fL   MCH, POC 28.9 27 - 31.2 pg   MCHC 33.8 31.8 - 35.4 g/dL   RDW, POC 14.4 %   Platelet Count, POC 181 142 - 424 K/uL   MPV 8.3 0 - 99.8 fL     Received the results of her follow-up CT on 11/23-  Called and discussed with her.  She may have a left ureteral stone but this is still not certain.  Will treat her with flomax, mobic, tramadol as needed for more severe pain (cautioned regarding sedation).  May stop abx now. Will refer to urology for follow-up.  She will purchase a urine strainer and use it at home.  She will let me know if any change, worsening or other concerns.    Lung bases are free of acute infiltrate or sizable effusion.  The liver demonstrates a few small hypodensities measuring less than 1 cm consistent with small cysts. There stable from the prior exam. The spleen, gallbladder, adrenal glands and pancreas are within normal limits. Kidneys are well visualized bilaterally. No renal calculi are identified. There is a 5 mm calcification medial to the left psoas muscle best seen on image number 45 of  series 2. This lies in the expected region of the mid left ureter and may represent a nonobstructing ureteral stone. The bladder is partially decompressed.  The appendix is within normal limits. Scattered diverticular change of the colon is noted. No diverticulitis is seen. The uterus has been surgically removed. No free pelvic fluid is noted. No acute bony abnormality is noted.  IMPRESSION: Possible left mid ureteral stone without obstructive change. Retrograde pyelography may be necessary to confirm these findings.  No other focal abnormality is noted.  Called 11/28 to check on her and discuss the rest of her labs.  All of her other labs look fine.  Let me know if not doing ok or if she does not hear about her urology appt soon.

## 2014-12-31 ENCOUNTER — Telehealth: Payer: Self-pay

## 2014-12-31 ENCOUNTER — Telehealth: Payer: Self-pay | Admitting: Radiology

## 2014-12-31 DIAGNOSIS — R109 Unspecified abdominal pain: Secondary | ICD-10-CM

## 2014-12-31 NOTE — Telephone Encounter (Signed)
Received message (phone call from [facility], the message was not sent to me) that she is calling about this CT.  I had not realized that she wanted to go ahead with the CT.  Ordered this test stat and called pt- no answer with her or her husband, [facility]

## 2014-12-31 NOTE — Telephone Encounter (Signed)
Spoke with Dr. Lorelei Pont who agreed to speak with pt concerning CT scan. She will also order the scan today.

## 2014-12-31 NOTE — Telephone Encounter (Signed)
Called Kathleen Douglas to inform her that I have her CT scheduled for 01/01/15 @ 11:30 @ [facility]. She is not to eat anything 4 hrs prior to study. Left VM with this info.

## 2014-12-31 NOTE — Telephone Encounter (Signed)
Patient left a message stating that it is for a CT scan of the abdomen and pelvis to examine for the possibility of a kidney stone.  She said Dr Lorelei Pont said there is evidence of calcification from the x-ray.  Notes from x-ray are below. The patient would like an early morning appointment.  Please advise, thank you.  IMPRESSION: 1. 2 mm calcification projecting over the left side of the pelvis could represent a ureteral stone. This may alternatively simply represent an additional pelvic phlebolith. If there is clinical concern for a ureteral stone, followup evaluation with noncontrast CT the abdomen and pelvis would be recommended.  CB#: [phone]

## 2014-12-31 NOTE — Telephone Encounter (Signed)
Pt would like a call back regarding a CT scan she may need. Seen by Dr Lorelei Pont. Please call [phone]

## 2015-01-01 ENCOUNTER — Encounter (HOSPITAL_COMMUNITY): Payer: Self-pay

## 2015-01-01 ENCOUNTER — Ambulatory Visit (HOSPITAL_COMMUNITY)
Admission: RE | Admit: 2015-01-01 | Discharge: 2015-01-01 | Disposition: A | Discharge status: Home / Self Care | Payer: Medicare Other | Source: Ambulatory Visit | Attending: Family Medicine | Admitting: Family Medicine

## 2015-01-01 DIAGNOSIS — R109 Unspecified abdominal pain: Secondary | ICD-10-CM

## 2015-01-01 DIAGNOSIS — R935 Abnormal findings on diagnostic imaging of other abdominal regions, including retroperitoneum: Secondary | ICD-10-CM | POA: Insufficient documentation

## 2015-01-01 LAB — URINE CULTURE
Colony Count: NO GROWTH
Organism ID, Bacteria: NO GROWTH

## 2015-01-01 MED ORDER — TAMSULOSIN HCL 0.4 MG PO CAPS: 0.4000 mg | ORAL_CAPSULE | Freq: Every day | ORAL | Status: DC

## 2015-01-01 MED ORDER — TRAMADOL HCL 50 MG PO TABS: 50.0000 mg | ORAL_TABLET | Freq: Three times a day (TID) | ORAL | Status: DC | PRN

## 2015-01-01 MED ORDER — MELOXICAM 7.5 MG PO TABS: 7.5000 mg | ORAL_TABLET | Freq: Every day | ORAL | Status: DC

## 2015-01-01 NOTE — Addendum Note (Signed)
Addended by: Lamar Blinks C on: 01/01/2015 03:32 PM   Modules accepted: Orders, Medications

## 2015-01-03 NOTE — Telephone Encounter (Signed)
Was this scheduled

## 2015-01-03 NOTE — Telephone Encounter (Signed)
A CT of the body was performed on 01/01/15 at 11:30am per the appt desk.

## 2015-01-06 ENCOUNTER — Encounter: Payer: Self-pay | Admitting: Family Medicine

## 2015-03-22 ENCOUNTER — Ambulatory Visit (INDEPENDENT_AMBULATORY_CARE_PROVIDER_SITE_OTHER): Payer: Medicare Other | Admitting: Family Medicine

## 2015-03-22 VITALS — BP 125/80 | HR 97 | Temp 98.6°F | Resp 20 | Ht 63.0 in | Wt 154.2 lb

## 2015-03-22 DIAGNOSIS — J069 Acute upper respiratory infection, unspecified: Secondary | ICD-10-CM

## 2015-03-22 DIAGNOSIS — R0981 Nasal congestion: Secondary | ICD-10-CM | POA: Diagnosis not present

## 2015-03-22 DIAGNOSIS — R05 Cough: Secondary | ICD-10-CM | POA: Diagnosis not present

## 2015-03-22 DIAGNOSIS — R059 Cough, unspecified: Secondary | ICD-10-CM

## 2015-03-22 MED ORDER — AMOXICILLIN-POT CLAVULANATE 875-125 MG PO TABS: 1.0000 | ORAL_TABLET | Freq: Two times a day (BID) | ORAL | Status: DC

## 2015-03-22 NOTE — Patient Instructions (Signed)
Zyrtec or claritin daily , nasacort nasal pray daily as needed IF no improvement then take augmentin twice daily for 10 days

## 2015-03-22 NOTE — Progress Notes (Signed)
Chief Complaint:  Chief Complaint  Patient presents with  . Cough    x 3 days  . Sore Throat    HPI: Kathleen Douglas is a 65 y.o. female who reports to [facility] today complaining of yellow phelgm, lots of things in nose and throat, she took off on Friday and Saturday and nt feeling well She is sneezing and itching and feels miserable.  She did get the flu vaccine this year. No msk aches. She still did her yoga. SHe feels it is mostly upper respiratory . She deneis alergies. Eveything was yellow. No fevers, chills Dayquil and nyquil and she is concerned not getting better  No past medical history on file. Past Surgical History  Procedure Laterality Date  . Abdominal hysterectomy    . Tubal ligation     Social History   Social History  . Marital Status: Married    Spouse Name: N/A  . Number of Children: N/A  . Years of Education: N/A   Social History Main Topics  . Smoking status: Never Smoker   . Smokeless tobacco: None  . Alcohol Use: No     Comment: socially  . Drug Use: No  . Sexual Activity: Yes    Birth Control/ Protection: None   Other Topics Concern  . None   Social History Narrative   Family History  Problem Relation Age of Onset  . Lymphoma Mother   . Cancer Father   . Hypertension Sister   . Hypertension Daughter   . Cancer Maternal Grandmother   . Diabetes Maternal Grandfather   . Cancer Paternal Grandmother    No Known Allergies Prior to Admission medications   Medication Sig Start Date End Date Taking? Authorizing Provider  Calcium Carbonate-Vitamin D (CALCIUM + D PO) Take 2 tablets by mouth daily. Reported on 03/22/2015   Yes Historical Provider, MD  meloxicam (MOBIC) 7.5 MG tablet Take 1 tablet (7.5 mg total) by mouth daily. May take 2 if needed.  Use for pain Patient not taking: Reported on 03/22/2015 01/01/15   Darreld Mclean, MD  tamsulosin (FLOMAX) 0.4 MG CAPS capsule Take 1 capsule (0.4 mg total) by mouth daily. Use as needed for kidney  stone Patient not taking: Reported on 03/22/2015 01/01/15   Darreld Mclean, MD  traMADol (ULTRAM) 50 MG tablet Take 1 tablet (50 mg total) by mouth every 8 (eight) hours as needed. Patient not taking: Reported on 03/22/2015 01/01/15   Darreld Mclean, MD     ROS: The patient denies fevers, chills, night sweats, unintentional weight loss, chest pain, palpitations, wheezing, dyspnea on exertion, nausea, vomiting, abdominal pain, dysuria, hematuria, melena, numbness, weakness, or tingling.   All other systems have been reviewed and were otherwise negative with the exception of those mentioned in the HPI and as above.    PHYSICAL EXAM: Filed Vitals:   03/22/15 1240  BP: 125/80  Pulse: 97  Temp: 98.6 F (37 C)  Resp: 20   Body mass index is 27.32 kg/(m^2).   General: Alert, no acute distress HEENT:  Normocephalic, atraumatic, oropharynx patent. EOMI, PERRLA Erythematous throat, no exudates, TM normal, + sinus tenderness, + erythematous/boggy nasal mucosa Cardiovascular:  Regular rate and rhythm, no rubs murmurs or gallops.  No Carotid bruits, radial pulse intact. No pedal edema.  Respiratory: Clear to auscultation bilaterally.  No wheezes, rales, or rhonchi.  No cyanosis, no use of accessory musculature Abdominal: No organomegaly, abdomen is soft and non-tender, positive bowel sounds.  No masses. Skin: No rashes. Neurologic: Facial musculature symmetric. Psychiatric: Patient acts appropriately throughout our interaction. Lymphatic: No cervical or submandibular lymphadenopathy Musculoskeletal: Gait intact. No edema, tenderness   LABS:    EKG/XRAY:   Primary read interpreted by Dr. Marin Comment at [facility].   ASSESSMENT/PLAN: Encounter Diagnoses  Name Primary?  . Acute upper respiratory infection Yes  . Cough   . Sinus congestion    Cont with otc coughmeds Rx augmentin Fu prn    Gross sideeffects, risk and benefits, and alternatives of medications d/w patient. Patient is aware  that all medications have potential sideeffects and we are unable to predict every sideeffect or drug-drug interaction that may occur.  Lucca Ballo DO  03/22/2015 1:04 PM

## 2015-03-26 ENCOUNTER — Telehealth: Payer: Self-pay

## 2015-03-26 NOTE — Telephone Encounter (Addendum)
Pt states he is still coughing at night mostly and didn't whether to continue with what was given or should they be on something else. Please call [phone]    [facility] [address]

## 2015-03-27 NOTE — Telephone Encounter (Signed)
Advised patient to finish abx course and RTC if still symptomatic after completing.

## 2015-05-14 ENCOUNTER — Encounter: Payer: Self-pay | Admitting: Podiatry

## 2015-05-14 ENCOUNTER — Ambulatory Visit (INDEPENDENT_AMBULATORY_CARE_PROVIDER_SITE_OTHER): Payer: Medicare Other | Admitting: Podiatry

## 2015-05-14 DIAGNOSIS — M2041 Other hammer toe(s) (acquired), right foot: Secondary | ICD-10-CM

## 2015-05-14 DIAGNOSIS — L84 Corns and callosities: Secondary | ICD-10-CM | POA: Diagnosis not present

## 2015-05-14 NOTE — Progress Notes (Signed)
   Subjective:    Patient ID: Kathleen Douglas, female    DOB: 28-Feb-1950, 65 y.o.   MRN: [id number]  HPI   This patient presents today complaining of a three-month history of a painful skin lesion the lateral aspect of the fifth right toe for the past 3 months uncomfortable walking and wearing shoes. Patient has self treated the area with soaking in Epsom salts and applying cortisone cream without any relief of symptoms. Patient has had surgery in the fifth right and left toes many years ago which resolved corns on the outer surface of these toes.  Review of Systems  Skin: Positive for color change.       Objective:   Physical Exam  Orientated 3  Vascular: DP and PT pulses 2/4 bilaterally Capillary reflex immediate bilaterally  Neurological: Sensation to 10 g monofilament wire intact 5/5 bilaterally Vibratory sensation equal and reactive bilaterally Ankle reflexes equal and reactive bilaterally  Dermatological: No open skin lesions bilaterally Well-organized callus in the lateral nail groove and posterior lateral nail fold fifth right toe which is tender to direct palpation  Musculoskeletal: HAV bilaterally The fifth toes or flail and palpable defect at the level of the proximal interphalangeal joint consistent with previous foot surgery Varus rotation fifth toes bilaterally     Assessment & Plan:   Assessment: Satisfactory neurovascular status bilaterally Listers corn fifth right toe Varus rotation fifth right toe/hammertoe  Plan: Today I advised patient that she had a corn on the lateral aspect of fifth right toe and I offered debridement of this corn making her aware that it would recur. The the listers corn on the fifth right toe was debrided with slight bleeding in the area. A topical antibiotic dressing was applied to the area. Patient advised to continue applying topical antibiotic ointment and Band-Aid to the area healed  Return as needed

## 2015-05-14 NOTE — Patient Instructions (Signed)
Today I removed a corn on the side of the fifth right toe resulting in some bleeding in the area. Topical antibiotic ointment and Band-Aid applied to the area. Continue to apply topical antibiotic ointment and Band-Aid daily until a scab forms and sensitivity ends. The corn will recur gradually over time. Wear wide toe box shoes as much as possible and return as needed

## 2015-06-05 ENCOUNTER — Ambulatory Visit (INDEPENDENT_AMBULATORY_CARE_PROVIDER_SITE_OTHER): Payer: Medicare Other | Admitting: Family Medicine

## 2015-06-05 VITALS — BP 120/82 | HR 84 | Temp 98.1°F | Resp 18 | Wt 153.0 lb

## 2015-06-05 DIAGNOSIS — J309 Allergic rhinitis, unspecified: Secondary | ICD-10-CM | POA: Diagnosis not present

## 2015-06-05 DIAGNOSIS — R05 Cough: Secondary | ICD-10-CM

## 2015-06-05 DIAGNOSIS — R059 Cough, unspecified: Secondary | ICD-10-CM

## 2015-06-05 DIAGNOSIS — H6502 Acute serous otitis media, left ear: Secondary | ICD-10-CM | POA: Diagnosis not present

## 2015-06-05 MED ORDER — AZITHROMYCIN 250 MG PO TABS: ORAL_TABLET | ORAL | Status: DC
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
## 2015-06-05 NOTE — Patient Instructions (Addendum)
IF you received an x-ray today, you will receive an invoice from [facility] Radiology. Please contact [facility]iology at [phone] with questions or concerns regarding your invoice.   IF you received labwork today, you will receive an invoice from [employer]. Please contact [facility] at [phone] with questions or concerns regarding your invoice.   Our billing staff will not be able to assist you with questions regarding bills from these companies.  You will be contacted with the lab results as soon as they are available. The fastest way to get your results is to activate your My Chart account. Instructions are located on the last page of this paperwork. If you have not heard from [place] regarding the results in 2 weeks, please contact this office.    Your symptoms may still be due to a virus or allergies. Can continue Zyrtec or Claritin over-the-counter, can try Flonase nasal spray, saline nasal spray as needed for congestion, and Mucinex or Mucinex DM as needed for cough.  Also see other information below on the pressure sensation in the ears which is likely serous otitis. If your productive cough is not improving into next week, you can fill the printed prescription for azithromycin. If you are running fevers, short of breath, or worsening, recommend recheck here or other care provider.  Cough, Adult Coughing is a reflex that clears your throat and your airways. Coughing helps to heal and protect your lungs. It is normal to cough occasionally, but a cough that happens with other symptoms or lasts a long time may be a sign of a condition that needs treatment. A cough may last only 2-3 weeks (acute), or it may last longer than 8 weeks (chronic). CAUSES Coughing is commonly caused by:  Breathing in substances that irritate your lungs.  A viral or bacterial respiratory infection.  Allergies.  Asthma.  Postnasal drip.  Smoking.  Acid backing up from  the stomach into the esophagus (gastroesophageal reflux).  Certain medicines.  Chronic lung problems, including COPD (or rarely, lung cancer).  Other medical conditions such as heart failure. HOME CARE INSTRUCTIONS  Pay attention to any changes in your symptoms. Take these actions to help with your discomfort:  Take medicines only as told by your health care provider.  If you were prescribed an antibiotic medicine, take it as told by your health care provider. Do not stop taking the antibiotic even if you start to feel better.  Talk with your health care provider before you take a cough suppressant medicine.  Drink enough fluid to keep your urine clear or pale yellow.  If the air is dry, use a cold steam vaporizer or humidifier in your bedroom or your home to help loosen secretions.  Avoid anything that causes you to cough at work or at home.  If your cough is worse at night, try sleeping in a semi-upright position.  Avoid cigarette smoke. If you smoke, quit smoking. If you need help quitting, ask your health care provider.  Avoid caffeine.  Avoid alcohol.  Rest as needed. SEEK MEDICAL CARE IF:   You have new symptoms.  You cough up pus.  Your cough does not get better after 2-3 weeks, or your cough gets worse.  You cannot control your cough with suppressant medicines and you are losing sleep.  You develop pain that is getting worse or pain that is not controlled with pain medicines.  You have a fever.  You have unexplained weight loss.  You  have night sweats. SEEK IMMEDIATE MEDICAL CARE IF:  You cough up blood.  You have difficulty breathing.  Your heartbeat is very fast.   This information is not intended to replace advice given to you by your health care provider. Make sure you discuss any questions you have with your health care provider.   Document Released: 07/24/2010 Document Revised: 10/16/2014 Document Reviewed: 04/03/2014 Elsevier Interactive  Patient Education 2016 Elsevier Inc.   Serous Otitis Media Serous otitis media is fluid in the middle ear space. This space contains the bones for hearing and air. Air in the middle ear space helps to transmit sound.  The air gets there through the eustachian tube. This tube goes from the back of the nose (nasopharynx) to the middle ear space. It keeps the pressure in the middle ear the same as the outside world. It also helps to drain fluid from the middle ear space. CAUSES  Serous otitis media occurs when the eustachian tube gets blocked. Blockage can come from:  Ear infections.  Colds and other upper respiratory infections.  Allergies.  Irritants such as cigarette smoke.  Sudden changes in air pressure (such as descending in an airplane).  Enlarged adenoids.  A mass in the nasopharynx. During colds and upper respiratory infections, the middle ear space can become temporarily filled with fluid. This can happen after an ear infection also. Once the infection clears, the fluid will generally drain out of the ear through the eustachian tube. If it does not, then serous otitis media occurs. SIGNS AND SYMPTOMS   Hearing loss.  A feeling of fullness in the ear, without pain.  Young children may not show any symptoms but may show slight behavioral changes, such as agitation, ear pulling, or crying. DIAGNOSIS  Serous otitis media is diagnosed by an ear exam. Tests may be done to check on the movement of the eardrum. Hearing exams may also be done. TREATMENT  The fluid most often goes away without treatment. If allergy is the cause, allergy treatment may be helpful. Fluid that persists for several months may require minor surgery. A small tube is placed in the eardrum to:  Drain the fluid.  Restore the air in the middle ear space. In certain situations, antibiotic medicines are used to avoid surgery. Surgery may be done to remove enlarged adenoids (if this is the cause). HOME CARE  INSTRUCTIONS   Keep children away from tobacco smoke.  Keep all follow-up visits as directed by your health care provider. SEEK MEDICAL CARE IF:   Your hearing is not better in 3 months.  Your hearing is worse.  You have ear pain.  You have drainage from the ear.  You have dizziness.  You have serous otitis media only in one ear or have any bleeding from your nose (epistaxis).  You notice a lump on your neck. MAKE SURE YOU:  Understand these instructions.   Will watch your condition.   Will get help right away if you are not doing well or get worse.    This information is not intended to replace advice given to you by your health care provider. Make sure you discuss any questions you have with your health care provider.   Document Released: 04/17/2003 Document Revised: 02/15/2014 Document Reviewed: 08/22/2012 Elsevier Interactive Patient Education [employer].

## 2016-02-11 ENCOUNTER — Other Ambulatory Visit: Payer: Self-pay | Admitting: Physician Assistant

## 2016-02-18 ENCOUNTER — Ambulatory Visit (INDEPENDENT_AMBULATORY_CARE_PROVIDER_SITE_OTHER): Payer: Medicare Other | Admitting: Family Medicine

## 2016-02-18 ENCOUNTER — Encounter: Payer: Self-pay | Admitting: Family Medicine

## 2016-02-18 VITALS — BP 130/80 | HR 80 | Temp 98.6°F | Resp 16 | Ht 63.0 in | Wt 150.6 lb

## 2016-02-18 DIAGNOSIS — Z1159 Encounter for screening for other viral diseases: Secondary | ICD-10-CM

## 2016-02-18 DIAGNOSIS — Z0000 Encounter for general adult medical examination without abnormal findings: Secondary | ICD-10-CM | POA: Diagnosis not present

## 2016-02-18 DIAGNOSIS — Z780 Asymptomatic menopausal state: Secondary | ICD-10-CM

## 2016-02-18 DIAGNOSIS — E7800 Pure hypercholesterolemia, unspecified: Secondary | ICD-10-CM

## 2016-02-18 DIAGNOSIS — E559 Vitamin D deficiency, unspecified: Secondary | ICD-10-CM | POA: Diagnosis not present

## 2016-02-18 DIAGNOSIS — Z23 Encounter for immunization: Secondary | ICD-10-CM

## 2016-02-18 NOTE — Progress Notes (Signed)
QUICK REFERENCE INFORMATION:  The ABCs of Providing the Initial Preventive Physical Examination CMS.gov Medicare Learning Network  Welcome To Medicare Physical Kathleen Douglas is a 66 y.o. female who presents for a Welcome to Medicare exam.  .HPI   There are no active problems to display for this patient.   History reviewed. No pertinent past medical history.   Past Surgical History:  Procedure Laterality Date  . ABDOMINAL HYSTERECTOMY    . TUBAL LIGATION       Outpatient Medications Prior to Visit  Medication Sig Dispense Refill  . Calcium Carbonate-Vitamin D (CALCIUM + D PO) Take 2 tablets by mouth daily. Reported on 06/05/2015    . multivitamin-iron-minerals-folic acid (CENTRUM) chewable tablet Chew 1 tablet by mouth daily.    Marland Kitchen azithromycin (ZITHROMAX) 250 MG tablet Take 2 pills by mouth on day 1, then 1 pill by mouth per day on days 2 through 5. (Patient not taking: Reported on 02/18/2016) 6 tablet 0   No facility-administered medications prior to visit.     No Known Allergies   Family History  Problem Relation Age of Onset  . Lymphoma Mother   . Cancer Father   . Hypertension Sister   . Hypertension Daughter   . Cancer Maternal Grandmother   . Diabetes Maternal Grandfather   . Cancer Paternal Grandmother      Social History   Social History  . Marital status: Married    Spouse name: N/A  . Number of children: N/A  . Years of education: N/A   Occupational History  . Not on file.   Social History Main Topics  . Smoking status: Never Smoker  . Smokeless tobacco: Never Used  . Alcohol use No     Comment: socially  . Drug use: No  . Sexual activity: Yes    Birth control/ protection: None   Other Topics Concern  . Not on file   Social History Narrative  . No narrative on file     Recent Hospitalizations? no  Current Medical Providers and Suppliers: [facility] Patient Care Team: No Pcp Per Patient as PCP - General (General Practice) No future  appointments.   Age-appropriate Screening Schedule: The list below includes current immunization status and future screening recommendations based on patient's age. Orders for these recommended tests are listed in the plan section. The patient has been provided with a written plan. Immunization History  Administered Date(s) Administered  . Influenza Split 02/21/2012  . Influenza,inj,Quad PF,36+ Mos 12/30/2014  . Pneumococcal Polysaccharide-23 02/21/2012  . Tdap 09/22/2013   Health Maintenance  Topic Date Due  . Hepatitis C Screening  [birth year]  . COLONOSCOPY  10/07/1999  . ZOSTAVAX  10/06/2009  . MAMMOGRAM  09/16/2014  . DEXA SCAN  10/07/2014  . PNA vac Low Risk Adult (1 of 2 - PCV13) 10/07/2014  . INFLUENZA VACCINE  09/09/2015  . TETANUS/TDAP  09/23/2023    Health Habits  Current exercise activities include: walking Exercise: 7 times/week. Diet: in general, a "healthy" diet    Alcohol: none  Depression Screen-PHQ2/9 completed today  Depression screen [facility] 2/9 02/18/2016  Decreased Interest 0  Down, Depressed, Hopeless 0  PHQ - 2 Score 0    Depression Severity and Treatment Recommendations:  0-4= None  5-9= Mild / Treatment: Support, educate to call if worse; return in one month  10-14= Moderate / Treatment: Support, watchful waiting; Antidepressant or Psycotherapy  15-19= Moderately severe / Treatment: Antidepressant OR Psychotherapy  >= 20 = Major depression, severe /  Antidepressant AND Psychotherapy  Functional Ability  Does the patient need help with: Ron Parker index)  Bathing: no  Dressing : no Toileting: no Transferring: no Feeding: no Does the patient have issues with incontinence: No   Safety Screen  Does the home have:  Rugs in the hallway: yes, padded Stairs in home: yes Handrails on the stairs: yes Poor lighting: no   Hearing Evaluation  Do you have trouble hearing the television when others do not? no  Do you have to strain to hear/understand  conversations? no   Falls Risk:  Does the patient need assistance with ambulation? no  Does the patient have a history of a fall in the last 90 days? No Is the patient at risk for falls? no Was the patient's timed "Get Up and Go Test" unsteady or longer than 30 seconds? no  Advanced Care Planning Patient has executed an Advance Directive: no  If no, patient was given the opportunity to execute an Advance Directive today? yes  Are the patient's advanced directives in [place]? yes  This patient has the ability to prepare an Advance Directive: yes Provider is willing to follow the patient's wishes: yes   Cognitive Assessment Does the patient have evidence of cognitive impairment? no The patient does not have evidence of a change in mood/affect, appearance,  speech, memory or motor skills.  ROS See HPI as well for ROS  General:   No fever, chills or recent illness. No change in weight Respiratory:                No cough or shortness of breath Cardiovascular:        No chest pain or palpitations GI:                           No abdominal pain, dyspepsia or change in bowel habits GU:    No dysuria or hesitancy    Objective:   Vitals:   02/18/16 1108  BP: 130/80  Pulse: 80  Resp: 16  Temp: 98.6 F (37 C)  TempSrc: Oral  SpO2: 98%  Weight: 150 lb 9.6 oz (68.3 kg)  Height: 5\' 3"  (1.6 m)    Body mass index is 26.68 kg/m.   Hearing/Vision exam: No exam data present  Physical Exam  Constitutional: She is oriented to person, place, and time. She appears well-developed and well-nourished.  HENT:  Head: Normocephalic and atraumatic.  Eyes: Conjunctivae and EOM are normal.  Cardiovascular: Normal rate, regular rhythm and normal heart sounds.   No murmur heard. Pulmonary/Chest: Effort normal and breath sounds normal. No respiratory distress. She has no wheezes.  Neurological: She is alert and oriented to person, place, and time. She has normal reflexes.  Psychiatric: She  has a normal mood and affect. Her behavior is normal. Judgment and thought content normal.       Assessment:  Welcome To Medicare Exam     Plan:  During the course of the visit the patient was educated and counseled about appropriate screening and preventive services including:  Osteoporosis Screening Cardiovascular disease screening Cholesterol screening Fall risk screening Audiology referrals   Discussed the patient's BMI with her. The BMI BMI is not in the acceptable range; BMI management plan is completed   The following orders were placed at today's visit;  Ernie was seen today for annual exam.  Diagnoses and all orders for this visit:  Welcome to Medicare preventive visit -  Cancel: EKG 12-Lead -     DG Bone Density; Future -     Lipid panel -     Comprehensive metabolic panel -     HCV Ab w/Rflx to Verification -     EKG 12-Lead; Future -     Ambulatory referral to Audiology  Postmenopausal- will refer for bone density screening  Gave number for [facility] -     DG Bone Density; Future  High cholesterol- reviewed cholesterol in 2014 and levels were high Will check today -     Lipid panel -     Comprehensive metabolic panel  Need for hepatitis C screening test -     HCV Ab w/Rflx to Verification  Vitamin D deficiency- will check vitamin D levels if her bone density shows osteopenia Otherwise continue vit d supplementation otc  Other orders -     Pneumococcal conjugate vaccine 13-valent IM  ECG not working today so pt should return in one week for ecg only.  She will not be billed at that time.      No future appointments.  Patient Instructions    We recommend that you schedule a mammogram for breast cancer screening. Typically, you do not need a referral to do this. Please contact a local imaging center to schedule your mammogram.  [facility] - [phone]  *ask for the Radiology Department [facility] - [phone] or [phone]  [facility] - [phone] [place] [phone] [facility] - [phone]  *ask for the [facility] - [phone]  *ask for the Radiology Department [facility] - [phone]  *ask for the [facility] - [phone]    IF you received an x-ray today, you will receive an invoice from [facility] Radiology. Please contact [facility] Radiology at [phone] with questions or concerns regarding your invoice.   IF you received labwork today, you will receive an invoice from [facility]. Please contact [facility] at [phone] with questions or concerns regarding your invoice.   Our billing staff will not be able to assist you with questions regarding bills from these companies.  You will be contacted with the lab results as soon as they are available. The fastest way to get your results is to activate your My Chart account. Instructions are located on the last page of this paperwork. If you have not heard from [place] regarding the results in 2 weeks, please contact this office.        An after visit summary with all of these plans was given to the patient.

## 2016-02-18 NOTE — Patient Instructions (Addendum)
We recommend that you schedule a mammogram for breast cancer screening. Typically, you do not need a referral to do this. Please contact a local imaging center to schedule your mammogram.  [facility] - [phone]  *ask for the Radiology Department [facility] - [phone] or [phone]  [facility] - [phone] [facility] - [phone] [facility]  - [phone]  *ask for the Radiology Department [facility] - [phone]  *ask for the Radiology Department [facility] - [phone]  *ask for the Mammography Department [facility] - [phone]     IF you received an x-ray today, you will receive an invoice from [facility]. Please contact [facility] at [phone] with questions or concerns regarding your invoice.   IF you received labwork today, you will receive an invoice from [facility]. Please contact [facility] at [phone] with questions or concerns regarding your invoice.   Our billing staff will not be able to assist you with questions regarding bills from these companies.  You will be contacted with the lab results as soon as they are available. The fastest way to get your results is to activate your My Chart account. Instructions are located on the last page of this paperwork. If you have not heard from us regarding the results in 2 weeks, please contact this office.      

## 2016-02-19 LAB — COMPREHENSIVE METABOLIC PANEL
ALT: 21 IU/L (ref 0–32)
AST: 22 IU/L (ref 0–40)
Albumin/Globulin Ratio: 1.8 (ref 1.2–2.2)
Albumin: 4.4 g/dL (ref 3.6–4.8)
Alkaline Phosphatase: 81 IU/L (ref 39–117)
BUN/Creatinine Ratio: 26 (ref 12–28)
BUN: 18 mg/dL (ref 8–27)
Bilirubin Total: 0.6 mg/dL (ref 0.0–1.2)
CO2: 26 mmol/L (ref 18–29)
Calcium: 9.8 mg/dL (ref 8.7–10.3)
Chloride: 100 mmol/L (ref 96–106)
Creatinine, Ser: 0.68 mg/dL (ref 0.57–1.00)
GFR calc Af Amer: 105 mL/min/{1.73_m2} (ref >59)
GFR calc non Af Amer: 91 mL/min/{1.73_m2} (ref >59)
Globulin, Total: 2.5 g/dL (ref 1.5–4.5)
Glucose: 82 mg/dL (ref 65–99)
Potassium: 4.3 mmol/L (ref 3.5–5.2)
Sodium: 141 mmol/L (ref 134–144)
Total Protein: 6.9 g/dL (ref 6.0–8.5)

## 2016-02-19 LAB — LIPID PANEL
Chol/HDL Ratio: 2.3 ratio units (ref 0.0–4.4)
Cholesterol, Total: 230 mg/dL — ABNORMAL HIGH (ref 100–199)
HDL: 101 mg/dL (ref >39)
LDL Calculated: 114 mg/dL — ABNORMAL HIGH (ref 0–99)
Triglycerides: 73 mg/dL (ref 0–149)
VLDL Cholesterol Cal: 15 mg/dL (ref 5–40)

## 2016-02-19 LAB — HCV INTERPRETATION

## 2016-02-19 LAB — HCV AB W/RFLX TO VERIFICATION: HCV Ab: <0.1 s/co ratio (ref 0.0–0.9)

## 2016-03-03 ENCOUNTER — Ambulatory Visit (INDEPENDENT_AMBULATORY_CARE_PROVIDER_SITE_OTHER): Payer: Medicare Other | Admitting: Family Medicine

## 2016-03-03 ENCOUNTER — Encounter: Payer: Self-pay | Admitting: Family Medicine

## 2016-03-03 VITALS — BP 142/86 | HR 78 | Temp 97.8°F | Resp 16 | Ht 63.0 in | Wt 152.0 lb

## 2016-03-03 DIAGNOSIS — Z23 Encounter for immunization: Secondary | ICD-10-CM

## 2016-03-03 DIAGNOSIS — E559 Vitamin D deficiency, unspecified: Secondary | ICD-10-CM | POA: Diagnosis not present

## 2016-03-03 DIAGNOSIS — Z0000 Encounter for general adult medical examination without abnormal findings: Secondary | ICD-10-CM | POA: Diagnosis not present

## 2016-03-03 DIAGNOSIS — E785 Hyperlipidemia, unspecified: Secondary | ICD-10-CM

## 2016-03-03 NOTE — Progress Notes (Signed)
Patient was not able to get her EKG at the last visit.  There is no charge for this visit.

## 2016-03-03 NOTE — Patient Instructions (Addendum)
We recommend that you schedule a mammogram for breast cancer screening. Typically, you do not need a referral to do this. Please contact a local imaging center to schedule your mammogram.  [facility] - [phone]  *ask for the Radiology Department [facility] - [phone] or [phone]  [facility] - [phone] [facility] - [phone] [facility] - [phone]  *ask for the Radiology Department [facility] - [phone]  *ask for the Radiology Department [facility] - [phone]  *ask for the Mammography Department [facility] - [phone]     IF you received an x-ray today, you will receive an invoice from [facility]. Please contact [facility] at [phone] with questions or concerns regarding your invoice.   IF you received labwork today, you will receive an invoice from [facility]. Please contact [facility] at [phone] with questions or concerns regarding your invoice.   Our billing staff will not be able to assist you with questions regarding bills from these companies.  You will be contacted with the lab results as soon as they are available. The fastest way to get your results is to activate your My Chart account. Instructions are located on the last page of this paperwork. If you have not heard from us regarding the results in 2 weeks, please contact this office.      

## 2016-03-10 ENCOUNTER — Encounter: Payer: Self-pay | Admitting: Family Medicine

## 2016-03-15 ENCOUNTER — Other Ambulatory Visit: Payer: Self-pay | Admitting: Family Medicine

## 2016-03-15 DIAGNOSIS — Z1231 Encounter for screening mammogram for malignant neoplasm of breast: Secondary | ICD-10-CM

## 2016-03-17 ENCOUNTER — Ambulatory Visit
Admission: RE | Admit: 2016-03-17 | Discharge: 2016-03-17 | Disposition: A | Discharge status: Home / Self Care | Payer: Medicare Other | Source: Ambulatory Visit | Attending: Family Medicine | Admitting: Family Medicine

## 2016-03-17 DIAGNOSIS — Z1231 Encounter for screening mammogram for malignant neoplasm of breast: Secondary | ICD-10-CM | POA: Diagnosis not present

## 2016-03-24 ENCOUNTER — Encounter: Payer: Self-pay | Admitting: Family Medicine

## 2016-03-24 DIAGNOSIS — Z780 Asymptomatic menopausal state: Secondary | ICD-10-CM | POA: Diagnosis not present

## 2016-03-24 DIAGNOSIS — M8589 Other specified disorders of bone density and structure, multiple sites: Secondary | ICD-10-CM | POA: Diagnosis not present

## 2017-03-15 ENCOUNTER — Ambulatory Visit: Payer: Medicare Other

## 2017-03-16 ENCOUNTER — Ambulatory Visit: Payer: Medicare Other | Admitting: Family Medicine

## 2017-03-16 ENCOUNTER — Ambulatory Visit: Payer: Medicare Other

## 2017-03-24 ENCOUNTER — Ambulatory Visit: Payer: Medicare Other | Admitting: Family Medicine

## 2017-03-24 ENCOUNTER — Ambulatory Visit: Payer: Medicare Other

## 2017-04-06 ENCOUNTER — Ambulatory Visit: Payer: Medicare Other | Admitting: Family Medicine

## 2017-08-10 ENCOUNTER — Ambulatory Visit: Payer: Medicare Other | Admitting: Physician Assistant

## 2017-08-30 ENCOUNTER — Other Ambulatory Visit: Payer: Self-pay | Admitting: Family Medicine

## 2017-08-30 ENCOUNTER — Other Ambulatory Visit: Payer: Self-pay

## 2017-08-30 ENCOUNTER — Encounter: Payer: Self-pay | Admitting: Family Medicine

## 2017-08-30 ENCOUNTER — Ambulatory Visit (INDEPENDENT_AMBULATORY_CARE_PROVIDER_SITE_OTHER): Payer: Medicare Other | Admitting: Family Medicine

## 2017-08-30 ENCOUNTER — Ambulatory Visit (INDEPENDENT_AMBULATORY_CARE_PROVIDER_SITE_OTHER): Payer: Medicare Other

## 2017-08-30 VITALS — BP 138/80 | HR 79 | Temp 98.7°F | Resp 16 | Ht 63.0 in | Wt 151.8 lb

## 2017-08-30 DIAGNOSIS — E559 Vitamin D deficiency, unspecified: Secondary | ICD-10-CM

## 2017-08-30 DIAGNOSIS — M25512 Pain in left shoulder: Secondary | ICD-10-CM

## 2017-08-30 DIAGNOSIS — M8588 Other specified disorders of bone density and structure, other site: Secondary | ICD-10-CM | POA: Diagnosis not present

## 2017-08-30 DIAGNOSIS — Z1211 Encounter for screening for malignant neoplasm of colon: Secondary | ICD-10-CM

## 2017-08-30 DIAGNOSIS — Z1231 Encounter for screening mammogram for malignant neoplasm of breast: Secondary | ICD-10-CM

## 2017-08-30 DIAGNOSIS — M19012 Primary osteoarthritis, left shoulder: Secondary | ICD-10-CM | POA: Diagnosis not present

## 2017-08-30 NOTE — Progress Notes (Signed)
Chief Complaint  Patient presents with  . pain left arm and hand    x 4 months, pain is intermittent, feels like a cuff on upper arm, numbness in hands and fingers.  Pt questions if she had a  heart attack/stroke.  Per pt she exercise daily.  Pt uncomfortable with pain in arm and it is more uncomfortable at night.  Pain level at highest 10/10 and lowest 2/10.  Pt thinks it could be vitamin d defiency as she has a hx of this.      HPI Left shoulder pain Pt reports that she has been having left shoulder pain that seems to radiate to her neck and some numbness down her left hand She states that she is worried about her heart She does not have a history of heart disease No current or recent chest pain but read that left shoulder pain could be from the heart No chest pain with exertion, with eating or BM She wants to know her ecg results  Low vitamin D She has been having some pains in her hand and wanted to get her vitamin D checked She reports that her energy level is good She still works in Scientist, research (medical) or works out  Osteopenia She has osteopenia and wanted to know what she could do to improve her bone density She is s/p hysterectomy and menopause She states that she went through menopause when she was in her 12s  She was put on HRT and took it for a while and then stopped   Colon Cancer Screening she has never had a colonoscopy she denies blood in his stool, unexpected weight loss or pain with defecation no rectal itching she does not smoke she does not have a family history of colon cancer   No past medical history on file.  Current Outpatient Medications  Medication Sig Dispense Refill  . Calcium Carbonate-Vitamin D (CALCIUM + D PO) Take 2 tablets by mouth daily. Reported on 06/05/2015    . multivitamin-iron-minerals-folic acid (CENTRUM) chewable tablet Chew 1 tablet by mouth daily.     No current facility-administered medications for this visit.     Allergies: No Known  Allergies  Past Surgical History:  Procedure Laterality Date  . ABDOMINAL HYSTERECTOMY    . BREAST BIOPSY Left    Excisional   . TUBAL LIGATION      Social History   Socioeconomic History  . Marital status: Married    Spouse name: Not on file  . Number of children: Not on file  . Years of education: Not on file  . Highest education level: Not on file  Occupational History  . Not on file  Social Needs  . Financial resource strain: Not on file  . Food insecurity:    Worry: Not on file    Inability: Not on file  . Transportation needs:    Medical: Not on file    Non-medical: Not on file  Tobacco Use  . Smoking status: Never Smoker  . Smokeless tobacco: Never Used  Substance and Sexual Activity  . Alcohol use: No    Comment: socially  . Drug use: No  . Sexual activity: Yes    Birth control/protection: None  Lifestyle  . Physical activity:    Days per week: Not on file    Minutes per session: Not on file  . Stress: Not on file  Relationships  . Social connections:    Talks on phone: Not on file    Gets together:  Not on file    Attends religious service: Not on file    Active member of club or organization: Not on file    Attends meetings of clubs or organizations: Not on file    Relationship status: Not on file  Other Topics Concern  . Not on file  Social History Narrative  . Not on file    Family History  Problem Relation Age of Onset  . Lymphoma Mother   . Cancer Father   . Hypertension Sister   . Hypertension Daughter   . Cancer Maternal Grandmother   . Diabetes Maternal Grandfather   . Cancer Paternal Grandmother      ROS Review of Systems See HPI Constitution: No fevers or chills No malaise No diaphoresis Skin: No rash or itching Eyes: no blurry vision, no double vision GU: no dysuria or hematuria Neuro: no dizziness or headaches all others reviewed and negative   Objective: Vitals:   08/30/17 1035  BP: 138/80  Pulse: 79  Resp: 16    Temp: 98.7 F (37.1 C)  TempSrc: Oral  SpO2: 98%  Weight: 151 lb 12.8 oz (68.9 kg)  Height: 5\' 3"  (1.6 m)    Physical Exam  Constitutional: She is oriented to person, place, and time. She appears well-developed and well-nourished.  HENT:  Head: Normocephalic and atraumatic.  Eyes: Conjunctivae and EOM are normal.  Cardiovascular: Normal rate, regular rhythm and normal heart sounds.  No murmur heard. Pulmonary/Chest: Effort normal and breath sounds normal. No stridor. No respiratory distress. She has no wheezes.  Musculoskeletal: Normal range of motion.       Right shoulder: She exhibits spasm. She exhibits normal range of motion, no tenderness, no bony tenderness, no swelling, no effusion, no crepitus, no deformity, no laceration, no pain, normal pulse and normal strength.       Left shoulder: She exhibits bony tenderness and spasm. She exhibits normal range of motion, no tenderness, no swelling, no effusion, no crepitus, no deformity, no laceration, no pain, normal pulse and normal strength.  Neurological: She is alert and oriented to person, place, and time.  Skin: Skin is warm. Capillary refill takes less than 2 seconds.  Psychiatric: She has a normal mood and affect. Her behavior is normal. Judgment and thought content normal.   CLINICAL DATA:  Left shoulder pain.  EXAM: LEFT SHOULDER - 2+ VIEW  COMPARISON:  No recent studies in [facility]  FINDINGS: The bones are subjectively adequately mineralized. The glenohumeral joint space is well maintained. The subacromial subdeltoid space is normal. There is a small amount of soft tissue calcification adjacent to the lateral margin of the acromion. There is mild narrowing of the AC joint.  IMPRESSION: Mild degenerative change of the [facility] joint. Normal appearing glenohumeral joint. I cannot exclude mild calcific change of the superior aspect of the rotator cuff as it passes under the acromion.   Electronically Signed   By:  David  Martinique M.D.   On: 08/30/2017 11:56     Assessment and Plan Macarena was seen today for pain left arm and hand.  Diagnoses and all orders for this visit:  Osteopenia of lumbar spine- reviewed bone density result -     VITAMIN D 25 Hydroxy (Vit-D Deficiency, Fractures)  Vitamin D deficiency- continue vitamin D and calcium -     VITAMIN D 25 Hydroxy (Vit-D Deficiency, Fractures)  Special screening for malignant neoplasms, colon- discussed cancer screening -     Ambulatory referral to Gastroenterology  Localized pain  of left shoulder joint-  Discussed that her symptoms could be due to her joint pain Referral placed for Orthopedics to evaluate and treat as xray suggestive of rotator cuff injury -     DG Shoulder Left; Future     Zoe A Nolon Rod

## 2017-08-30 NOTE — Patient Instructions (Addendum)
Your bone density scan shows OSTEOPENIA. Osteopenia is low bone density.  Menopause is a risk factor. To decrease your fracture risk we advised taking an adequate amount of weight bearing exercise and nutrients. Nutrition plays an important role in maintaining healthy, strong bones. Make sure you get enough calcium every day from food or from calcium supplements. If you are age 68 or younger, aim to get 1,000 mg of calcium every day. If you are older than age 84, aim to get 1,200 mg of calcium every day. Try to get enough vitamin D every day. If you are age 71 or younger, aim to get 600 international units (IU) every day. If you are older than age 44, aim to get 800 international units every day.     IF you received an x-ray today, you will receive an invoice from [facility]. Please contact [facility] at [phone] with questions or concerns regarding your invoice.   IF you received labwork today, you will receive an invoice from [facility]. Please contact [facility] at [phone] with questions or concerns regarding your invoice.   Our billing staff will not be able to assist you with questions regarding bills from these companies.  You will be contacted with the lab results as soon as they are available. The fastest way to get your results is to activate your My Chart account. Instructions are located on the last page of this paperwork. If you have not heard from [place] regarding the results in 2 weeks, please contact this office.    We recommend that you schedule a mammogram for breast cancer screening. Typically, you do not need a referral to do this. Please contact a local imaging center to schedule your mammogram.  [facility] - [phone]  *ask for the Radiology Department [facility] - [phone] or [phone]  [facility] - [phone] [place] [phone] [facility] - [phone]  *ask for the [facility] - [phone]  *ask for the Radiology Department [facility] - [phone]  *ask for the [place] - [phone]

## 2017-08-31 LAB — VITAMIN D 25 HYDROXY (VIT D DEFICIENCY, FRACTURES): Vit D, 25-Hydroxy: 28.8 ng/mL — ABNORMAL LOW (ref 30.0–100.0)

## 2017-09-01 ENCOUNTER — Telehealth: Payer: Self-pay | Admitting: Family Medicine

## 2017-09-01 NOTE — Telephone Encounter (Signed)
Copied from [place] [phone]. Topic: Quick Communication - See Telephone Encounter >> Sep 01, 2017  3:01 PM Bea Graff, NT wrote: CRM for notification. See Telephone encounter for: 09/01/17. Pt would like a call to discuss lab results and to see if medication will be ordered.

## 2017-09-07 ENCOUNTER — Ambulatory Visit
Admission: RE | Admit: 2017-09-07 | Discharge: 2017-09-07 | Disposition: A | Discharge status: Home / Self Care | Payer: Medicare Other | Source: Ambulatory Visit | Attending: Family Medicine | Admitting: Family Medicine

## 2017-09-07 ENCOUNTER — Encounter: Payer: Self-pay | Admitting: Family Medicine

## 2017-09-07 DIAGNOSIS — Z1231 Encounter for screening mammogram for malignant neoplasm of breast: Secondary | ICD-10-CM | POA: Diagnosis not present

## 2017-09-13 ENCOUNTER — Ambulatory Visit (INDEPENDENT_AMBULATORY_CARE_PROVIDER_SITE_OTHER): Payer: Medicare Other | Admitting: Orthopaedic Surgery

## 2017-09-13 ENCOUNTER — Encounter (INDEPENDENT_AMBULATORY_CARE_PROVIDER_SITE_OTHER): Payer: Self-pay | Admitting: Orthopaedic Surgery

## 2017-09-13 ENCOUNTER — Other Ambulatory Visit (INDEPENDENT_AMBULATORY_CARE_PROVIDER_SITE_OTHER): Payer: Self-pay | Admitting: Radiology

## 2017-09-13 VITALS — BP 145/82 | HR 69 | Ht 63.0 in | Wt 151.0 lb

## 2017-09-13 DIAGNOSIS — G8929 Other chronic pain: Secondary | ICD-10-CM | POA: Diagnosis not present

## 2017-09-13 DIAGNOSIS — M25512 Pain in left shoulder: Secondary | ICD-10-CM | POA: Diagnosis not present

## 2017-09-13 MED ORDER — METHYLPREDNISOLONE ACETATE 40 MG/ML IJ SUSP
80.0000 mg | INTRAMUSCULAR | Status: AC | PRN
  Administered 2017-09-13: 80 mg

## 2017-09-13 MED ORDER — LIDOCAINE HCL 2 % IJ SOLN
2.0000 mL | INTRAMUSCULAR | Status: AC | PRN
  Administered 2017-09-13: 2 mL

## 2017-09-13 MED ORDER — BUPIVACAINE HCL 0.5 % IJ SOLN
2.0000 mL | INTRAMUSCULAR | Status: AC | PRN
  Administered 2017-09-13: 2 mL via INTRA_ARTICULAR
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
## 2017-10-18 ENCOUNTER — Ambulatory Visit: Payer: Medicare Other | Attending: Orthopaedic Surgery | Admitting: Physical Therapy

## 2017-10-18 ENCOUNTER — Encounter: Payer: Self-pay | Admitting: Physical Therapy

## 2017-10-18 DIAGNOSIS — M25512 Pain in left shoulder: Secondary | ICD-10-CM | POA: Diagnosis not present

## 2017-10-18 DIAGNOSIS — G8929 Other chronic pain: Secondary | ICD-10-CM | POA: Insufficient documentation

## 2017-10-18 DIAGNOSIS — R600 Localized edema: Secondary | ICD-10-CM
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
## 2017-10-24 ENCOUNTER — Encounter: Payer: Self-pay | Admitting: Family Medicine

## 2017-10-24 ENCOUNTER — Ambulatory Visit (INDEPENDENT_AMBULATORY_CARE_PROVIDER_SITE_OTHER): Payer: Medicare Other | Admitting: Family Medicine

## 2017-10-24 ENCOUNTER — Other Ambulatory Visit: Payer: Self-pay

## 2017-10-24 VITALS — BP 134/78 | HR 81 | Temp 98.1°F | Resp 16 | Ht 63.0 in | Wt 153.0 lb

## 2017-10-24 DIAGNOSIS — M79602 Pain in left arm: Secondary | ICD-10-CM

## 2017-10-24 DIAGNOSIS — M25512 Pain in left shoulder: Secondary | ICD-10-CM | POA: Diagnosis not present

## 2017-10-24 DIAGNOSIS — M5412 Radiculopathy, cervical region: Secondary | ICD-10-CM | POA: Diagnosis not present

## 2017-10-24 MED ORDER — MELOXICAM 7.5 MG PO TABS: 7.5000 mg | ORAL_TABLET | Freq: Every day | ORAL | 0 refills | Status: DC

## 2017-10-24 MED ORDER — CYCLOBENZAPRINE HCL 5 MG PO TABS: ORAL_TABLET | ORAL | 0 refills | Status: DC

## 2017-10-24 NOTE — Progress Notes (Signed)
Subjective:  By signing my name below, I, Essence Howell, attest that this documentation has been prepared under the direction and in the presence of Wendie Agreste, MD Electronically Signed: Ladene Artist, ED Scribe 10/24/2017 at 2:44 PM.   Patient ID: Kathleen Douglas, female    DOB: Mar 17, 1950, 67 y.o.   MRN: [id number]  Chief Complaint  Patient presents with  . Shoulder Pain    that starts in the arm and radiates into the neck x 1 year ( left side)    HPI  Kathleen Douglas is a 67 y.o. female who presents to Primary Care at [facility] complaining of L shoulder pain that radiates into neck x 1 yr. Appears she has been followed by ortho, appointment with Dr. Durward Fortes 8/6. Impingement syndrome noted at that time. She had a subacromial injection and plan for PT. PT eval 9/10.  Pt states that she was having burning, throbbing L shoulder pain that radiates into her L arm and fingers. She was given a cortisone injection which provided relief for 2 wks. Pt states that she also noticed warmth, intermittent subjective swelling/tightness to upper arm, pain radiating into fingers with intermittent blue color to fingers 1 month ago. Also reports having hives to L arm that lasted for 1-2 days ~2.5 wks ago and a burning sensation that radiated into her L chest this morning that has since resolved. Pt has been taking Aleve prn for pain. Denies chronic weakness/dropping objects, falls/injuries, fever, night sweats, unexplained weight loss. Pt is R hand dominant. States she does yoga and pilates daily.  Patient Active Problem List   Diagnosis Date Noted  . Dyslipidemia 03/03/2016  . Vitamin D deficiency 03/03/2016   Past Medical History:  Diagnosis Date  . Osteopenia   . Vitamin D deficiency    Past Surgical History:  Procedure Laterality Date  . ABDOMINAL HYSTERECTOMY    . BREAST BIOPSY Left    Excisional   . TUBAL LIGATION     No Known Allergies Prior to Admission medications   Medication Sig Start  Date End Date Taking? Authorizing Provider  Calcium Carbonate-Vitamin D (CALCIUM + D PO) Take 4 tablets by mouth daily. Reported on 06/05/2015   Yes [provider]  multivitamin-iron-minerals-folic acid (CENTRUM) chewable tablet Chew 1 tablet by mouth daily.   Yes [provider]   Social History   Socioeconomic History  . Marital status: Married    Spouse name: Not on file  . Number of children: Not on file  . Years of education: Not on file  . Highest education level: Not on file  Occupational History  . Not on file  Social Needs  . Financial resource strain: Not on file  . Food insecurity:    Worry: Not on file    Inability: Not on file  . Transportation needs:    Medical: Not on file    Non-medical: Not on file  Tobacco Use  . Smoking status: Never Smoker  . Smokeless tobacco: Never Used  Substance and Sexual Activity  . Alcohol use: No    Comment: socially  . Drug use: No  . Sexual activity: Yes    Birth control/protection: None  Lifestyle  . Physical activity:    Days per week: Not on file    Minutes per session: Not on file  . Stress: Not on file  Relationships  . Social connections:    Talks on phone: Not on file    Gets together: Not on file  Attends religious service: Not on file    Active member of club or organization: Not on file    Attends meetings of clubs or organizations: Not on file    Relationship status: Not on file  . Intimate partner violence:    Fear of current or ex partner: Not on file    Emotionally abused: Not on file    Physically abused: Not on file    Forced sexual activity: Not on file  Other Topics Concern  . Not on file  Social History Narrative  . Not on file   Review of Systems  Constitutional: Negative for diaphoresis, fever and unexpected weight change.  Musculoskeletal: Positive for arthralgias, joint swelling (subjective) and neck pain.  Neurological: Negative for weakness.      Objective:    Physical Exam  Constitutional: She is oriented to person, place, and time. She appears well-developed and well-nourished. No distress.  HENT:  Head: Normocephalic and atraumatic.  Eyes: Conjunctivae and EOM are normal.  Neck: Neck supple. No tracheal deviation present.  Cardiovascular: Normal rate.  Pulses:      Radial pulses are 2+ on the right side, and 2+ on the left side.  Pulmonary/Chest: Effort normal. No respiratory distress.  Musculoskeletal: Normal range of motion.  Locates L trapezius as areas of prev pain. Slight tenderness of L upper trapezius with spasms. Decreased extension. Rotation 45 degrees L, 55-60 degrees R. Equal grip strength and UE strength. No blisters or rash to L UE. Healed abrasion on L lateral UE. Hand veins are flat with arm elevation. Radial pulse 2+. Cap refill at fingers 2 seconds. UE circumference 15 cm proximal to tip of olecranon: 27 cm on R, 28 cm on L. [place], AC, clavicle nontender. L shoulder: FROM. Neg Hawkins, pos Neer.  Neurological: She is alert and oriented to person, place, and time.  Reflex Scores:      Tricep reflexes are 2+ on the right side and 1+ on the left side.      Bicep reflexes are 2+ on the right side and 2+ on the left side.      Brachioradialis reflexes are 2+ on the right side and 2+ on the left side. Skin: Skin is warm and dry.  Psychiatric: She has a normal mood and affect. Her behavior is normal.  Nursing note and vitals reviewed.  Vitals:   10/24/17 1432  BP: 134/78  Pulse: 81  Resp: 16  Temp: 98.1 F (36.7 C)  TempSrc: Oral  SpO2: 97%  Weight: 153 lb (69.4 kg)  Height: 5\' 3"  (1.6 m)  Prev records reviewed    Assessment & Plan:    Kathleen Douglas is a 67 y.o. female Left arm pain - Plan: meloxicam (MOBIC) 7.5 MG tablet, cyclobenzaprine (FLEXERIL) 5 MG tablet  Left shoulder pain, unspecified chronicity - Plan: meloxicam (MOBIC) 7.5 MG tablet, cyclobenzaprine (FLEXERIL) 5 MG tablet  Cervical radiculopathy - Plan:  meloxicam (MOBIC) 7.5 MG tablet, cyclobenzaprine (FLEXERIL) 5 MG tablet  Possible combination of previous impingement syndrome with symptoms suspicious for cervical radiculopathy.  Possible slight decreased triceps reflex on the left but no appreciable weakness.  No rash, no apparent significant arm swelling.  Neurovascular intact distally to the fingertips.   -Trial of meloxicam 7.5 mg daily, Flexeril up to every 8 hours as needed, potential side effects with discussed, lowest effective dose.  She would prefer not to use prednisone if possible.  -Recheck with primary provider in the next week to 10 days to determine  if imaging needed or orthopedic eval.   -If rash, or swelling of arm returns, RTC/ER precautions given.  Meds ordered this encounter  Medications  . meloxicam (MOBIC) 7.5 MG tablet    Sig: Take 1 tablet (7.5 mg total) by mouth daily.    Dispense:  30 tablet    Refill:  0  . cyclobenzaprine (FLEXERIL) 5 MG tablet    Sig: 1/2 to 1 tab by mouth up to every 8 hours as needed. Start at bedtime as needed due to sedation    Dispense:  15 tablet    Refill:  0   Patient Instructions   Arm symptoms may be related to a pinched nerve coming from your neck.    You can try meloxicam up to once per day as needed for the discomfort, muscle relaxant up to every 8 hours but start with a low dose of a half a pill at bedtime initially due to side effects.  Please recheck with your primary care provider in the next week to 10 days, sooner if worse.  We can determine at follow-up visit if x-rays needed or further evaluation with orthopedics.  Return to the clinic or go to the nearest emergency room if any of your symptoms worsen or new symptoms occur.   Cervical Radiculopathy Cervical radiculopathy happens when a nerve in the neck (cervical nerve) is pinched or bruised. This condition can develop because of an injury or as part of the normal aging process. Pressure on the cervical nerves can cause  pain or numbness that runs from the neck all the way down into the arm and fingers. Usually, this condition gets better with rest. Treatment may be needed if the condition does not improve. What are the causes? This condition may be caused by:  Injury.  Slipped (herniated) disk.  Muscle tightness in the neck because of overuse.  Arthritis.  Breakdown or degeneration in the bones and joints of the spine (spondylosis) due to aging.  Bone spurs that may develop near the cervical nerves.  What are the signs or symptoms? Symptoms of this condition include:  Pain that runs from the neck to the arm and hand. The pain can be severe or irritating. It may be worse when the neck is moved.  Numbness or weakness in the affected arm and hand.  How is this diagnosed? This condition may be diagnosed based on symptoms, medical history, and a physical exam. You may also have tests, including:  X-rays.  CT scan.  MRI.  Electromyogram (EMG).  Nerve conduction tests.  How is this treated? In many cases, treatment is not needed for this condition. With rest, the condition usually gets better over time. If treatment is needed, options may include:  Wearing a soft neck collar for short periods of time.  Physical therapy to strengthen your neck muscles.  Medicines, such as NSAIDs, oral corticosteroids, or spinal injections.  Surgery. This may be needed if other treatments do not help. Various types of surgery may be done depending on the cause of your problems.  Follow these instructions at home: Managing pain  Take over-the-counter and prescription medicines only as told by your health care provider.  If directed, apply ice to the affected area. ? Put ice in a plastic bag. ? Place a towel between your skin and the bag. ? Leave the ice on for 20 minutes, 2-3 times per day.  If ice does not help, you can try using heat. Take a warm shower or  warm bath, or use a heat pack as told by  your health care provider.  Try a gentle neck and shoulder massage to help relieve symptoms. Activity  Rest as needed. Follow instructions from your health care provider about any restrictions on activities.  Do stretching and strengthening exercises as told by your health care provider or physical therapist. General instructions  If you were given a soft collar, wear it as told by your health care provider.  Use a flat pillow when you sleep.  Keep all follow-up visits as told by your health care provider. This is important. Contact a health care provider if:  Your condition does not improve with treatment. Get help right away if:  Your pain gets much worse and cannot be controlled with medicines.  You have weakness or numbness in your hand, arm, face, or leg.  You have a high fever.  You have a stiff, rigid neck.  You lose control of your bowels or your bladder (have incontinence).  You have trouble with walking, balance, or speaking. This information is not intended to replace advice given to you by your health care provider. Make sure you discuss any questions you have with your health care provider. Document Released: 10/20/2000 Document Revised: 07/03/2015 Document Reviewed: 03/21/2014 Elsevier Interactive Patient Education  [employer].     If you have lab work done today you will be contacted with your lab results within the next 2 weeks.  If you have not heard from [place] then please contact us. The fastest way to get your results is to register for My Chart.   IF you received an x-ray today, you will receive an invoice from [facility] Radiology. Please contact [facility] Radiology at [phone] with questions or concerns regarding your invoice.   IF you received labwork today, you will receive an invoice from [facility]. Please contact [facility] at [phone] with questions or concerns regarding your invoice.   Our billing staff will not be able to assist you  with questions regarding bills from these companies.  You will be contacted with the lab results as soon as they are available. The fastest way to get your results is to activate your My Chart account. Instructions are located on the last page of this paperwork. If you have not heard from [place] regarding the results in 2 weeks, please contact this office.       I personally performed the services described in this documentation, which was scribed in my presence. The recorded information has been reviewed and considered for accuracy and completeness, addended by me as needed, and agree with information above.  Signed,   Merri Ray, MD Primary [facility] at [facility].  10/24/17 3:38 PM

## 2017-10-24 NOTE — Patient Instructions (Addendum)
Arm symptoms may be related to a pinched nerve coming from your neck.    You can try meloxicam up to once per day as needed for the discomfort, muscle relaxant up to every 8 hours but start with a low dose of a half a pill at bedtime initially due to side effects.  Please recheck with your primary care provider in the next week to 10 days, sooner if worse.  We can determine at follow-up visit if x-rays needed or further evaluation with orthopedics.  Return to the clinic or go to the nearest emergency room if any of your symptoms worsen or new symptoms occur.   Cervical Radiculopathy Cervical radiculopathy happens when a nerve in the neck (cervical nerve) is pinched or bruised. This condition can develop because of an injury or as part of the normal aging process. Pressure on the cervical nerves can cause pain or numbness that runs from the neck all the way down into the arm and fingers. Usually, this condition gets better with rest. Treatment may be needed if the condition does not improve. What are the causes? This condition may be caused by:  Injury.  Slipped (herniated) disk.  Muscle tightness in the neck because of overuse.  Arthritis.  Breakdown or degeneration in the bones and joints of the spine (spondylosis) due to aging.  Bone spurs that may develop near the cervical nerves.  What are the signs or symptoms? Symptoms of this condition include:  Pain that runs from the neck to the arm and hand. The pain can be severe or irritating. It may be worse when the neck is moved.  Numbness or weakness in the affected arm and hand.  How is this diagnosed? This condition may be diagnosed based on symptoms, medical history, and a physical exam. You may also have tests, including:  X-rays.  CT scan.  MRI.  Electromyogram (EMG).  Nerve conduction tests.  How is this treated? In many cases, treatment is not needed for this condition. With rest, the condition usually gets better  over time. If treatment is needed, options may include:  Wearing a soft neck collar for short periods of time.  Physical therapy to strengthen your neck muscles.  Medicines, such as NSAIDs, oral corticosteroids, or spinal injections.  Surgery. This may be needed if other treatments do not help. Various types of surgery may be done depending on the cause of your problems.  Follow these instructions at home: Managing pain  Take over-the-counter and prescription medicines only as told by your health care provider.  If directed, apply ice to the affected area. ? Put ice in a plastic bag. ? Place a towel between your skin and the bag. ? Leave the ice on for 20 minutes, 2-3 times per day.  If ice does not help, you can try using heat. Take a warm shower or warm bath, or use a heat pack as told by your health care provider.  Try a gentle neck and shoulder massage to help relieve symptoms. Activity  Rest as needed. Follow instructions from your health care provider about any restrictions on activities.  Do stretching and strengthening exercises as told by your health care provider or physical therapist. General instructions  If you were given a soft collar, wear it as told by your health care provider.  Use a flat pillow when you sleep.  Keep all follow-up visits as told by your health care provider. This is important. Contact a health care provider if:  Your  condition does not improve with treatment. Get help right away if:  Your pain gets much worse and cannot be controlled with medicines.  You have weakness or numbness in your hand, arm, face, or leg.  You have a high fever.  You have a stiff, rigid neck.  You lose control of your bowels or your bladder (have incontinence).  You have trouble with walking, balance, or speaking. This information is not intended to replace advice given to you by your health care provider. Make sure you discuss any questions you have with  your health care provider. Document Released: 10/20/2000 Document Revised: 07/03/2015 Document Reviewed: 03/21/2014 Elsevier Interactive Patient Education  [employer].     If you have lab work done today you will be contacted with your lab results within the next 2 weeks.  If you have not heard from [place] then please contact us. The fastest way to get your results is to register for My Chart.   IF you received an x-ray today, you will receive an invoice from [facility] Radiology. Please contact [facility] Radiology at [phone] with questions or concerns regarding your invoice.   IF you received labwork today, you will receive an invoice from [facility]. Please contact [facility] at [phone] with questions or concerns regarding your invoice.   Our billing staff will not be able to assist you with questions regarding bills from these companies.  You will be contacted with the lab results as soon as they are available. The fastest way to get your results is to activate your My Chart account. Instructions are located on the last page of this paperwork. If you have not heard from [place] regarding the results in 2 weeks, please contact this office.

## 2017-10-31 DIAGNOSIS — M7989 Other specified soft tissue disorders: Secondary | ICD-10-CM | POA: Diagnosis not present

## 2017-11-03 ENCOUNTER — Encounter

## 2017-11-03 ENCOUNTER — Ambulatory Visit: Payer: Medicare Other | Admitting: Physical Therapy

## 2017-11-03 ENCOUNTER — Encounter: Payer: Self-pay | Admitting: Physical Therapy

## 2017-11-03 DIAGNOSIS — R600 Localized edema: Secondary | ICD-10-CM

## 2017-11-03 DIAGNOSIS — G8929 Other chronic pain: Secondary | ICD-10-CM

## 2017-11-03 DIAGNOSIS — M25512 Pain in left shoulder: Secondary | ICD-10-CM | POA: Diagnosis not present

## 2017-11-03 NOTE — Therapy (Signed)
[place], [place], [address] Phone: [phone]   Fax:  [phone]  Physical Therapy Treatment  Patient Details  Name: Kathleen Douglas MRN: [id number] Date of Birth: 03/26/1950 Referring Provider (PT): Kathleen Fears, MD   Encounter Date: 11/03/2017  PT End of Session - 11/03/17 1145    Visit Number  2    Number of Visits  12    Date for PT Re-Evaluation  11/29/17    Authorization Type  MCR    PT Start Time  1146    PT Stop Time  1228    PT Time Calculation (min)  42 min    Activity Tolerance  Patient tolerated treatment well    Behavior During Therapy  [facility] for tasks assessed/performed       Past Medical History:  Diagnosis Date  . Osteopenia   . Vitamin D deficiency     Past Surgical History:  Procedure Laterality Date  . ABDOMINAL HYSTERECTOMY    . BREAST BIOPSY Left    Excisional   . TUBAL LIGATION      There were no vitals filed for this visit.  Subjective Assessment - 11/03/17 1148    Subjective  "I did return a physician to check out the swelling in my elbow and they said to continue with my physical therapy. I've been doing my exercises which i do think it does feel better after I've done my exercises which makes it feels like it gets that relief feeling"     Currently in Pain?  Yes    Pain Score  2     Pain Orientation  Left    Pain Descriptors / Indicators  Throbbing    Pain Type  Chronic pain    Aggravating Factors   sleeping, reaching, and lifting    Pain Relieving Factors  activity, the exercises                       [facility] Adult PT Treatment/Exercise - 11/03/17 0001      Exercises   Exercises  Shoulder      Shoulder Exercises: Supine   Protraction  10 reps;Strengthening;Left   with tactile cues for proper height     Shoulder Exercises: Seated   Other Seated Exercises  lower trap strengthening 2 x 10 with red theraband with elbows on bolster      Shoulder Exercises: Standing   External Rotation  10 reps;Strengthening;Left;Theraband    Internal Rotation  Strengthening;10 reps;Theraband;Left    x 2    Theraband Level (Shoulder Internal Rotation)  Level 2 (Red)    Flexion  10 reps;Weights   scaption angle to shoulder height   Shoulder Flexion Weight (lbs)  1    Row  10 reps;Strengthening;Both   x 2 sets     Manual Therapy   Manual Therapy  Joint mobilization    Manual therapy comments  MTPR along the L upper trap/ levator scapulae, and L scalene    Joint Mobilization  L first rib grade 3             PT Education - 11/03/17 1229    Education Details  reviewed previously provided HEP    Person(s) Educated  Patient    Methods  Explanation;Verbal cues;Demonstration    Comprehension  Verbalized understanding;Verbal cues required;Returned demonstration          PT Long Term Goals - 10/18/17 0957      PT LONG  TERM GOAL #1   Title  Pt will be I and compliant with HEP. 6 weeks 11/29/17    Status  New      PT LONG TERM GOAL #2   Title  Pt will improve Lt shoulder AROM to [facility] at least 160 deg flexion and abd. 6 weeks 11/29/17    Status  New      PT LONG TERM GOAL #3   Title  Pt will improve Lt shoulder strength to at least 4+/5 MMT. 6 weeks 11/29/17    Status  New      PT LONG TERM GOAL #4   Title  Pt will improve FOTO to less than 29% limited. 6 weeks 11/29/17      PT LONG TERM GOAL #5   Title  Pt will improve sleep quality by having less shoulder pain and discomfort sleeping. 6 weeks 11/29/17    Status  New            Plan - 11/03/17 1230    Clinical Impression Statement  Kathleen Douglas reports she has been consistent with her HEP and states she is feeling better especially after the exercise, but continues to have N/T in the 1st, 2nd and 3rd digits of the L hand. following manual and first rib mobs she reported no N/T in the UE. continued strengthening of the shoulder and reviewed previously provided HEP which  she required minimal cues on proper form.     PT Treatment/Interventions  Cryotherapy;Electrical Stimulation;Iontophoresis 4mg /ml Dexamethasone;Traction;Ultrasound;Therapeutic exercise;Neuromuscular re-education;Patient/family education;Manual techniques;Passive range of motion;Dry needling;Taping;Vasopneumatic Device    PT Next Visit Plan  rewiew HEP, shoulder stretching, ROM, and RTC/scap strengthening, modalaties and MT PRN    PT Home Exercise Plan  rows, IR/ ER, posterior capsule stretching, wall flexion stretch, pec stretch    Consulted and Agree with Plan of Care  Patient       Patient will benefit from skilled therapeutic intervention in order to improve the following deficits and impairments:  Decreased activity tolerance, Decreased range of motion, Decreased strength, Increased fascial restricitons, Pain, Postural dysfunction  Visit Diagnosis: Chronic left shoulder pain  Localized edema     Problem List Patient Active Problem List   Diagnosis Date Noted  . Dyslipidemia 03/03/2016  . Vitamin D deficiency 03/03/2016   Kathleen Douglas PT, DPT, LAT, ATC  11/03/17  12:33 PM      [place] [facility] [address] [place], [place], [address] Phone: [phone]   Fax:  [phone]  Name: Kathleen Douglas MRN: [id number] Date of Birth: 1950/10/08

## 2017-11-05 ENCOUNTER — Ambulatory Visit: Payer: Medicare Other | Admitting: Family Medicine

## 2017-11-16 ENCOUNTER — Other Ambulatory Visit: Payer: Self-pay | Admitting: Family Medicine

## 2017-11-16 ENCOUNTER — Ambulatory Visit: Payer: Medicare Other | Attending: Orthopaedic Surgery | Admitting: Physical Therapy

## 2017-11-16 ENCOUNTER — Encounter: Payer: Self-pay | Admitting: Physical Therapy

## 2017-11-16 DIAGNOSIS — R600 Localized edema: Secondary | ICD-10-CM | POA: Diagnosis not present

## 2017-11-16 DIAGNOSIS — M79602 Pain in left arm: Secondary | ICD-10-CM

## 2017-11-16 DIAGNOSIS — M25512 Pain in left shoulder: Secondary | ICD-10-CM | POA: Insufficient documentation

## 2017-11-16 DIAGNOSIS — M5412 Radiculopathy, cervical region: Secondary | ICD-10-CM

## 2017-11-16 DIAGNOSIS — G8929 Other chronic pain: Secondary | ICD-10-CM

## 2017-11-16 NOTE — Telephone Encounter (Signed)
Requested medication (s) are due for refill today: Yes  Requested medication (s) are on the active medication list: Yes  Last refill:  11/01/17  Future visit scheduled: Yes  Notes to clinic:  See request    Requested Prescriptions  Pending Prescriptions Disp Refills   meloxicam (MOBIC) 7.5 MG tablet [Pharmacy Med Name: MELOXICAM 7.5 MG TABLET] 30 tablet 0    Sig: TAKE 1 TABLET BY MOUTH EVERY DAY     Analgesics:  COX2 Inhibitors Failed - 11/16/2017 12:07 AM      Failed - HGB in normal range and within 360 days    Hemoglobin  Date Value Ref Range Status  12/30/2014 13.0 12.2 - 16.2 g/dL Final         Failed - Cr in normal range and within 360 days    Creat  Date Value Ref Range Status  12/30/2014 0.62 0.50 - 0.99 mg/dL Final   Creatinine, Ser  Date Value Ref Range Status  02/18/2016 0.68 0.57 - 1.00 mg/dL Final         Passed - Patient is not pregnant      Passed - Valid encounter within last 12 months    Recent Outpatient Visits          3 weeks ago Left arm pain   [facility] at [facility], Ranell Patrick, MD   2 months ago Osteopenia of lumbar spine   [facility] at [facility], Arlie Solomons, MD   1 year ago Flu vaccine need   [facility] at [facility], Arlie Solomons, MD   1 year ago Welcome to [facility] preventive visit   Primary Care at [facility], Arlie Solomons, MD   2 years ago Cough   [facility] at [facility], Ranell Patrick, MD      Future Appointments            In 2 weeks Forrest Moron, MD [facility] at [place], [facility]

## 2017-11-16 NOTE — Therapy (Signed)
[place], [place], [address] Phone: [phone]   Fax:  [phone]  Physical Therapy Treatment  Patient Details  Name: Kathleen Douglas MRN: [id number] Date of Birth: December 26, 1950 Referring Provider (PT): Joni Fears, MD   Encounter Date: 11/16/2017  PT End of Session - 11/16/17 0848    Visit Number  3    Number of Visits  12    Date for PT Re-Evaluation  11/29/17    Authorization Type  MCR    PT Start Time  0848    PT Stop Time  0928    PT Time Calculation (min)  40 min    Activity Tolerance  Patient tolerated treatment well    Behavior During Therapy  [facility] for tasks assessed/performed       Past Medical History:  Diagnosis Date  . Osteopenia   . Vitamin D deficiency     Past Surgical History:  Procedure Laterality Date  . ABDOMINAL HYSTERECTOMY    . BREAST BIOPSY Left    Excisional   . TUBAL LIGATION      There were no vitals filed for this visit.  Subjective Assessment - 11/16/17 0850    Subjective  "I am doing much better today, some soreness in the L shoulder the N/T is alot better"     Currently in Pain?  Yes    Pain Score  1     Pain Location  Shoulder    Pain Orientation  Left    Pain Descriptors / Indicators  Sore    Pain Type  Chronic pain    Pain Onset  More than a month ago    Pain Frequency  Intermittent    Aggravating Factors   heavy lifting    Pain Relieving Factors  stretching, the exercise                       [facility] Adult PT Treatment/Exercise - 11/16/17 0001      Self-Care   Self-Care  Other Self-Care Comments    Other Self-Care Comments   how to perform manual trigger point release and how to perform       Exercises   Exercises  Shoulder      Shoulder Exercises: Standing   External Rotation  Strengthening;15 reps;Theraband;Left    Theraband Level (Shoulder External Rotation)  Level 1 (Yellow)    External Rotation Limitations  regressed to  yellow band to promote full ROM with strengthening    Internal Rotation  Strengthening;Left;15 reps;Theraband    Theraband Level (Shoulder Internal Rotation)  Level 1 (Yellow)    Row  Strengthening;Both;15 reps;Theraband    Theraband Level (Shoulder Row)  Level 3 (Green)    Other Standing Exercises  lower trap Y wall lift offs 2 x 10    demonstration for proper form   Other Standing Exercises  wall push up (from counter) 2 x 10   demonstration for proper form     Shoulder Exercises: Stretch   Other Shoulder Stretches  upper trap stretch 2 x 30 sec      Manual Therapy   Manual therapy comments  MTPR along the infraspinatus/ rhomboids and upper trap             PT Education - 11/16/17 0926    Education Details  how to perform MTPR and use of tools to assist with technique and where she can find these tools. updated HEP for stretching and lower  trap stretching and wall push up    Person(s) Educated  Patient    Methods  Explanation;Verbal cues    Comprehension  Verbal cues required;Verbalized understanding          PT Long Term Goals - 10/18/17 0957      PT LONG TERM GOAL #1   Title  Pt will be I and compliant with HEP. 6 weeks 11/29/17    Status  New      PT LONG TERM GOAL #2   Title  Pt will improve Lt shoulder AROM to [facility] at least 160 deg flexion and abd. 6 weeks 11/29/17    Status  New      PT LONG TERM GOAL #3   Title  Pt will improve Lt shoulder strength to at least 4+/5 MMT. 6 weeks 11/29/17    Status  New      PT LONG TERM GOAL #4   Title  Pt will improve FOTO to less than 29% limited. 6 weeks 11/29/17      PT LONG TERM GOAL #5   Title  Pt will improve sleep quality by having less shoulder pain and discomfort sleeping. 6 weeks 11/29/17    Status  New            Plan - 11/16/17 2500    Clinical Impression Statement  pt reports significant improvement since previous session. continued STW along infraspaintus and Rhomboids providing education on how to  perofrm at home using theracane and where she can purchase tools. she performed all exercises well today with no complaints of pain regressed band resistance to promote full motion but worked on Engineer, production to promote work related activities. no pain noted at end of session.     PT Next Visit Plan  rewiew HEP, shoulder stretching, ROM, and RTC/scap strengthening, modalaties and MT PRN    PT Home Exercise Plan  rows, IR/ ER, posterior capsule stretching, wall flexion stretch, pec stretch, wall push up, lower trap y's,     Consulted and Agree with Plan of Care  Patient       Patient will benefit from skilled therapeutic intervention in order to improve the following deficits and impairments:  Decreased activity tolerance, Decreased range of motion, Decreased strength, Increased fascial restricitons, Pain, Postural dysfunction  Visit Diagnosis: Chronic left shoulder pain  Localized edema     Problem List Patient Active Problem List   Diagnosis Date Noted  . Dyslipidemia 03/03/2016  . Vitamin D deficiency 03/03/2016   Starr Lake PT, DPT, LAT, ATC  11/16/17  9:31 AM      [facility] [address] [place], [place], [address] Phone: [phone]   Fax:  [phone]  Name: Marvin Maenza MRN: [id number] Date of Birth: May 05, 1950

## 2017-11-22 NOTE — Telephone Encounter (Signed)
Patient is requesting a refill of the following medications: Requested Prescriptions   Pending Prescriptions Disp Refills  . meloxicam (MOBIC) 7.5 MG tablet [Pharmacy Med Name: MELOXICAM 7.5 MG TABLET] 30 tablet 0    Sig: TAKE 1 TABLET BY MOUTH EVERY DAY    Date of patient request: 11/16/2017 Last office visit: 10/24/2017 Date of last refill: 10/24/2017 Last refill amount: 30 Follow up time period per chart:

## 2017-11-23 ENCOUNTER — Ambulatory Visit: Payer: Medicare Other | Admitting: Physical Therapy

## 2017-11-23 ENCOUNTER — Encounter: Payer: Self-pay | Admitting: Physical Therapy

## 2017-11-23 DIAGNOSIS — M25512 Pain in left shoulder: Secondary | ICD-10-CM | POA: Diagnosis not present

## 2017-11-23 DIAGNOSIS — R600 Localized edema: Secondary | ICD-10-CM

## 2017-11-23 DIAGNOSIS — G8929 Other chronic pain: Secondary | ICD-10-CM | POA: Diagnosis not present

## 2017-11-23 NOTE — Therapy (Signed)
[place], [place], [address] Phone: [phone]   Fax:  [phone]  Physical Therapy Treatment  Patient Details  Name: Kathleen Douglas MRN: [id number] Date of Birth: 01/30/1951 Referring Provider (PT): Joni Fears, MD   Encounter Date: 11/23/2017  PT End of Session - 11/23/17 0847    Visit Number  4    Number of Visits  12    Date for PT Re-Evaluation  11/29/17    Authorization Type  MCR    PT Start Time  0847    PT Stop Time  0925    PT Time Calculation (min)  38 min    Activity Tolerance  Patient tolerated treatment well    Behavior During Therapy  [facility] for tasks assessed/performed       Past Medical History:  Diagnosis Date  . Osteopenia   . Vitamin D deficiency     Past Surgical History:  Procedure Laterality Date  . ABDOMINAL HYSTERECTOMY    . BREAST BIOPSY Left    Excisional   . TUBAL LIGATION      There were no vitals filed for this visit.  Subjective Assessment - 11/23/17 0847    Subjective  "the trunk show went well at work and no pain, only some soreness at night which is only intermittent"     Patient Stated Goals  be able to sleep better    Currently in Pain?  No/denies    Pain Score  0-No pain         [facility] PT Assessment - 11/23/17 0001      Observation/Other Assessments   Focus on Therapeutic Outcomes (FOTO)   31% limited      AROM   Left Shoulder Flexion  150 Degrees    Left Shoulder ABduction  160 Degrees      Strength   Left Shoulder Flexion  4/5    Left Shoulder ABduction  4/5   alittle soreness which may be due to the work show last nigh   Left Shoulder Internal Rotation  4+/5    Left Shoulder External Rotation  4+/5                   [facility] Adult PT Treatment/Exercise - 11/23/17 0001      Self-Care   Self-Care  Posture    Posture  sleeping positiong using bolster beneath the knees and pillow under the arms to promote proper posture and  comfort to reduce sleeping on the shoulder      Exercises   Exercises  Shoulder      Shoulder Exercises: Supine   Other Supine Exercises  sustained isometric horizontal abduction against pilates circle with bil shoulder flesoin 2 x 10       Shoulder Exercises: Standing   Protraction  Strengthening;15 reps;Both   wall push up   Flexion  Strengthening;10 reps;Both   with sustained horizontal adduction on pilates circles   Other Standing Exercises  pilates circle horizontal adduction 1 x 10 holding 2 sec    Other Standing Exercises  wall washing flexion and abduction 2 x going to fatigue CW/ CCW             PT Education - 11/23/17 0912    Education Details  proper sleeping position. updated HEP for wall washing. pt progress compared to initial measures and POC for the next visit in 2 weeks to assess her functional progress without PT.     Person(s) Educated  Patient    Methods  Explanation;Verbal cues    Comprehension  Verbalized understanding;Verbal cues required          PT Long Term Goals - 11/23/17 0859      PT LONG TERM GOAL #1   Title  Pt will be I and compliant with HEP. 6 weeks 11/29/17    Period  Weeks    Status  On-going      PT LONG TERM GOAL #2   Title  Pt will improve Lt shoulder AROM to [facility] at least 160 deg flexion and abd. 6 weeks 11/29/17    Status  Partially Met      PT LONG TERM GOAL #3   Title  Pt will improve Lt shoulder strength to at least 4+/5 MMT. 6 weeks 11/29/17    Baseline  4+/5 ER/ IR, 4/5 flexion/ abduction    Period  Weeks    Status  Partially Met      PT LONG TERM GOAL #4   Title  Pt will improve FOTO to less than 29% limited. 6 weeks 11/29/17    Baseline  31% limited    Period  Weeks    Status  Partially Met      PT LONG TERM GOAL #5   Title  Pt will improve sleep quality by having less shoulder pain and discomfort sleeping. 6 weeks 11/29/17    Baseline  wakes intermittently but not as frequently as before PT.    Status   Partially Met            Plan - 11/23/17 0923    Clinical Impression Statement  pt reports she is doing better, is fatigued today only due to doing alot yesterday with work. she is making progress with ROM and strength, and is making excellent progress with goals. continued working on strengthening and scapular stability which she performs well. plan to see pt back in 2 weeks to assess how she does resuming regular activity without PT and if she is doing well then discharch at that time.     PT Treatment/Interventions  Cryotherapy;Electrical Stimulation;Iontophoresis 5m/ml Dexamethasone;Traction;Ultrasound;Therapeutic exercise;Neuromuscular re-education;Patient/family education;Manual techniques;Passive range of motion;Dry needling;Taping;Vasopneumatic Device    PT Next Visit Plan  rewiew HEP, shoulder stretching, ROM, and RTC/scap strengthening, modalaties and MT PRN    PT Home Exercise Plan  rows, IR/ ER, posterior capsule stretching, wall flexion stretch, pec stretch, wall push up, lower trap y's,     Consulted and Agree with Plan of Care  Patient       Patient will benefit from skilled therapeutic intervention in order to improve the following deficits and impairments:  Decreased activity tolerance, Decreased range of motion, Decreased strength, Increased fascial restricitons, Pain, Postural dysfunction  Visit Diagnosis: Chronic left shoulder pain  Localized edema     Problem List Patient Active Problem List   Diagnosis Date Noted  . Dyslipidemia 03/03/2016  . Vitamin D deficiency 03/03/2016   KStarr LakePT, DPT, LAT, ATC  11/23/17  9:27 AM      [facility] [address]Phone: [phone]  Fax:  [phone] Name: Kathleen PostenMRN: [id number] of Birth: 824-May-1952

## 2017-11-23 NOTE — Telephone Encounter (Signed)
In PT.  Refilled Mobic for now.

## 2017-11-29 NOTE — Progress Notes (Signed)
QUICK REFERENCE INFORMATION: The ABCs of Providing the Annual Wellness Visit  CMS.gov Medicare Learning Network  [employer] Annual Wellness Visit  Subjective:   Kathleen Douglas is a 67 y.o. Female who presents for an Annual Wellness Visit.  Patient Active Problem List   Diagnosis Date Noted  . Dyslipidemia 03/03/2016  . Vitamin D deficiency 03/03/2016    Past Medical History:  Diagnosis Date  . Osteopenia   . Vitamin D deficiency      Past Surgical History:  Procedure Laterality Date  . ABDOMINAL HYSTERECTOMY    . BREAST BIOPSY Left    Excisional   . COLONOSCOPY  2004   Dr.Martin Wynetta Emery  . TUBAL LIGATION       Outpatient Medications Prior to Visit  Medication Sig Dispense Refill  . Calcium Carbonate-Vitamin D (CALCIUM + D PO) Take 4 tablets by mouth daily. Reported on 06/05/2015    . multivitamin-iron-minerals-folic acid (CENTRUM) chewable tablet Chew 1 tablet by mouth daily.    . cyclobenzaprine (FLEXERIL) 5 MG tablet 1/2 to 1 tab by mouth up to every 8 hours as needed. Start at bedtime as needed due to sedation (Patient not taking: Reported on 11/30/2017) 15 tablet 0  . meloxicam (MOBIC) 7.5 MG tablet TAKE 1 TABLET BY MOUTH EVERY DAY (Patient not taking: Reported on 11/30/2017) 30 tablet 0   No facility-administered medications prior to visit.     No Known Allergies   Family History  Problem Relation Age of Onset  . Lymphoma Mother   . Cancer Father   . Hypertension Sister   . Hypertension Daughter   . Cancer Maternal Grandmother   . Diabetes Maternal Grandfather   . Cancer Paternal Grandmother   . Colon cancer Neg Hx   . Esophageal cancer Neg Hx   . Rectal cancer Neg Hx   . Stomach cancer Neg Hx   . Colon polyps Neg Hx      Social History   Socioeconomic History  . Marital status: Married    Spouse name: Not on file  . Number of children: Not on file  . Years of education: Not on file  . Highest education level: Not on file  Occupational History   . Not on file  Social Needs  . Financial resource strain: Not on file  . Food insecurity:    Worry: Not on file    Inability: Not on file  . Transportation needs:    Medical: Not on file    Non-medical: Not on file  Tobacco Use  . Smoking status: Never Smoker  . Smokeless tobacco: Never Used  Substance and Sexual Activity  . Alcohol use: Yes    Comment: socially, occ.  . Drug use: No  . Sexual activity: Yes    Birth control/protection: None  Lifestyle  . Physical activity:    Days per week: Not on file    Minutes per session: Not on file  . Stress: Not on file  Relationships  . Social connections:    Talks on phone: Not on file    Gets together: Not on file    Attends religious service: Not on file    Active member of club or organization: Not on file    Attends meetings of clubs or organizations: Not on file    Relationship status: Not on file  Other Topics Concern  . Not on file  Social History Narrative  . Not on file      Recent Hospitalizations? No  Health Risk  Assessment: The patient has completed a Health Risk Assessment. This has been reveiwed with them and has been scanned into the Oral system as an attached document.  Current Medical Providers and Suppliers: [facility] Patient Care Team: Forrest Moron, MD as PCP - General (Internal Medicine) No future appointments.   Age-appropriate Screening Schedule: The list below includes current immunization status and future screening recommendations based on patient's age. Orders for these recommended tests are listed in the plan section. The patient has been provided with a written plan. Immunization History  Administered Date(s) Administered  . Influenza Split 02/21/2012  . Influenza, High Dose Seasonal PF 11/30/2017  . Influenza,inj,Quad PF,6+ Mos 12/30/2014, 03/03/2016  . Pneumococcal Conjugate-13 02/18/2016  . Pneumococcal Polysaccharide-23 02/21/2012  . Tdap 09/22/2013     Depression  Screen-PHQ2/9 completed today  Depression screen [facility] 2/9 11/30/2017 10/24/2017 08/30/2017 03/03/2016 02/18/2016  Decreased Interest 0 0 0 0 0  Down, Depressed, Hopeless 0 0 0 0 0  PHQ - 2 Score 0 0 0 0 0       Depression Severity and Treatment Recommendations:  0-4= None  5-9= Mild / Treatment: Support, educate to call if worse; return in one month  10-14= Moderate / Treatment: Support, watchful waiting; Antidepressant or Psycotherapy  15-19= Moderately severe / Treatment: Antidepressant OR Psychotherapy  >= 20 = Major depression, severe / Antidepressant AND Psychotherapy  Functional Status Survey:      Hearing Evaluation: 1. Do you have trouble hearing the television when others do not?   No 2. Do you have to strain to hear/understand conversations? No   Advanced Care Planning: 1. Patient has executed an Advance Directive: No 2. If no, patient was given the opportunity to execute an Advance Directive today? No 3. Are the patient's advanced directives in [place]? No 4. This patient has the ability to prepare an Advance Directive: Yes 5. Provider is willing to follow the patient's wishes: Yes  Cognitive Assessment: Does the patient have evidence of cognitive impairment? No The patient does not have any evidence of any cognitive problems and denies any  change in mood/affect, appearance, speech, memory or motor skills.  Identification of Risk Factors: Risk factors include: hyperlipidemia  Review of Systems  Constitutional: Negative for chills and fever.  Respiratory: Negative for cough and wheezing.   Cardiovascular: Negative for chest pain and palpitations.  Gastrointestinal: Negative for abdominal pain, nausea and vomiting.  Neurological: Negative for dizziness and headaches.  Psychiatric/Behavioral: Negative for depression. The patient is not nervous/anxious.     Objective:   Vitals:   11/30/17 0916  BP: (!) 154/82  Pulse: 69  Resp: 14  Temp: 98.2 F (36.8 C)   TempSrc: Oral  SpO2: 97%  Weight: 151 lb 6.4 oz (68.7 kg)  Height: 5\' 3"  (1.6 m)    Body mass index is 26.82 kg/m.  BP (!) 154/82 (BP Location: Right Arm, Patient Position: Sitting, Cuff Size: Large)   Pulse 69   Temp 98.2 F (36.8 C) (Oral)   Resp 14   Ht 5\' 3"  (1.6 m)   Wt 151 lb 6.4 oz (68.7 kg)   SpO2 97%   BMI 26.82 kg/m   Physical Exam  Constitutional: Oriented to person, place, and time. Appears well-developed and well-nourished.  HENT:  Head: Normocephalic and atraumatic.  Eyes: Conjunctivae and EOM are normal.  Cardiovascular: Normal rate, regular rhythm, normal heart sounds and intact distal pulses.  No murmur heard. Pulmonary/Chest: Effort normal and breath sounds normal. No stridor. No respiratory  distress. Has no wheezes.  Neurological: Is alert and oriented to person, place, and time.  Skin: Skin is warm. Capillary refill takes less than 2 seconds.  Psychiatric: Has a normal mood and affect. Behavior is normal. Judgment and thought content normal.      Assessment/Plan:   Patient Self-Management and Personalized Health Advice The patient has been provided with information about:  reduce salt in diet and cooking, use calcium 1 gram daily with Vit D and continue current medications  During the course of the visit the patient was educated and counseled about appropriate screening and preventive services including:   return annually or prn     Body mass index is 26.82 kg/m. Discussed the patient's BMI with her. The BMI BMI is in the acceptable range  Kathleen Douglas was seen today for medicare wellness.  Diagnoses and all orders for this visit:  Encounter for Medicare annual wellness exam  Dyslipidemia -     Lipid panel -     Comprehensive metabolic panel  Colon cancer screening -     Ambulatory referral to Gastroenterology  Need for prophylactic vaccination and inoculation against influenza  Other orders -     Flu vaccine HIGH DOSE PF (Fluzone High  dose)      Return in about 6 months (around 06/01/2018).  No future appointments.  Patient Instructions       If you have lab work done today you will be contacted with your lab results within the next 2 weeks.  If you have not heard from [place] then please contact us. The fastest way to get your results is to register for My Chart.   IF you received an x-ray today, you will receive an invoice from [facility]. Please contact [facility] at [phone] with questions or concerns regarding your invoice.   IF you received labwork today, you will receive an invoice from [place]. Please contact [facility] at [phone] with questions or concerns regarding your invoice.   Our billing staff will not be able to assist you with questions regarding bills from these companies.  You will be contacted with the lab results as soon as they are available. The fastest way to get your results is to activate your My Chart account. Instructions are located on the last page of this paperwork. If you have not heard from [place] regarding the results in 2 weeks, please contact this office.        Recombinant Zoster (Shingles) Vaccine, RZV: What You Need to Know 1. Why get vaccinated? Shingles (also called herpes zoster, or just zoster) is a painful skin rash, often with blisters. Shingles is caused by the varicella zoster virus, the same virus that causes chickenpox. After you have chickenpox, the virus stays in your body and can cause shingles later in life. You can't catch shingles from another person. However, a person who has never had chickenpox (or chickenpox vaccine) could get chickenpox from someone with shingles. A shingles rash usually appears on one side of the face or body and heals within 2 to 4 weeks. Its main symptom is pain, which can be severe. Other symptoms can include fever, headache, chills and upset stomach. Very rarely, a shingles infection can lead to pneumonia,  hearing problems, blindness, brain inflammation (encephalitis), or death. For about 1 person in 5, severe pain can continue even long after the rash has cleared up. This long-lasting pain is called post-herpetic neuralgia (PHN). Shingles is far more common in people 77 years of age  and older than in younger people, and the risk increases with age. It is also more common in people whose immune system is weakened because of a disease such as cancer, or by drugs such as steroids or chemotherapy. At least 1 million people a year in the [place] get shingles. 2. Shingles vaccine (recombinant) Recombinant shingles vaccine was approved by FDA in 2017 for the prevention of shingles. In clinical trials, it was more than 90% effective in preventing shingles. It can also reduce the likelihood of PHN. Two doses, 2 to 6 months apart, are recommended for adults 72 and older. This vaccine is also recommended for people who have already gotten the live shingles vaccine (Zostavax). There is no live virus in this vaccine. 3. Some people should not get this vaccine Tell your vaccine provider if you:  Have any severe, life-threatening allergies. A person who has ever had a life-threatening allergic reaction after a dose of recombinant shingles vaccine, or has a severe allergy to any component of this vaccine, may be advised not to be vaccinated. Ask your health care provider if you want information about vaccine components.  Are pregnant or breastfeeding. There is not much information about use of recombinant shingles vaccine in pregnant or nursing women. Your healthcare provider might recommend delaying vaccination.  Are not feeling well. If you have a mild illness, such as a cold, you can probably get the vaccine today. If you are moderately or severely ill, you should probably wait until you recover. Your doctor can advise you.  4. Risks of a vaccine reaction With any medicine, including vaccines, there is a  chance of reactions. After recombinant shingles vaccination, a person might experience:  Pain, redness, soreness, or swelling at the site of the injection  Headache, muscle aches, fever, shivering, fatigue  In clinical trials, most people got a sore arm with mild or moderate pain after vaccination, and some also had redness and swelling where they got the shot. Some people felt tired, had muscle pain, a headache, shivering, fever, stomach pain, or nausea. About 1 out of 6 people who got recombinant zoster vaccine experienced side effects that prevented them from doing regular activities. Symptoms went away on their own in about 2 to 3 days. Side effects were more common in younger people. You should still get the second dose of recombinant zoster vaccine even if you had one of these reactions after the first dose. Other things that could happen after this vaccine:  People sometimes faint after medical procedures, including vaccination. Sitting or lying down for about 15 minutes can help prevent fainting and injuries caused by a fall. Tell your provider if you feel dizzy or have vision changes or ringing in the ears.  Some people get shoulder pain that can be more severe and longer-lasting than routine soreness that can follow injections. This happens very rarely.  Any medication can cause a severe allergic reaction. Such reactions to a vaccine are estimated at about 1 in a million doses, and would happen within a few minutes to a few hours after the vaccination. As with any medicine, there is a very remote chance of a vaccine causing a serious injury or death. The safety of vaccines is always being monitored. For more information, visit: http://www.aguilar.org/ 5. What if there is a serious problem? What should I look for?  Look for anything that concerns you, such as signs of a severe allergic reaction, very high fever, or unusual behavior. Signs of a severe  allergic reaction can include  hives, swelling of the face and throat, difficulty breathing, a fast heartbeat, dizziness, and weakness. These would usually start a few minutes to a few hours after the vaccination. What should I do?  If you think it is a severe allergic reaction or other emergency that can't wait, call 9-1-1 and get to the nearest hospital. Otherwise, call your health care provider. Afterward, the reaction should be reported to the Vaccine Adverse Event Reporting System (VAERS). Your doctor should file this report, or you can do it yourself through the VAERS web site atwww.vaers.https://www.bray.com/ by calling 269-849-0788. VAERS does not give medical advice. 6. How can I learn more?  Ask your healthcare provider. He or she can give you the vaccine package insert or suggest other sources of information.  Call your local or state health department.  Contact the Centers for Disease Control and Prevention (CDC): ? Call 506 835 2821 (1-800-CDC-INFO) or ? Visit the CDC's website at http://hunter.com/ CDC Vaccine Information Statement (VIS) Recombinant Zoster Vaccine (03/22/2016) This information is not intended to replace advice given to you by your health care provider. Make sure you discuss any questions you have with your health care provider. Document Released: 04/06/2016 Document Revised: 04/06/2016 Document Reviewed: 04/06/2016 Elsevier Interactive Patient Education  [employer].    An after visit summary with all of these plans was given to the patient.

## 2017-11-30 ENCOUNTER — Encounter: Payer: Self-pay | Admitting: Gastroenterology

## 2017-11-30 ENCOUNTER — Ambulatory Visit (INDEPENDENT_AMBULATORY_CARE_PROVIDER_SITE_OTHER): Payer: Medicare Other | Admitting: Family Medicine

## 2017-11-30 ENCOUNTER — Encounter: Payer: Self-pay | Admitting: Family Medicine

## 2017-11-30 VITALS — BP 154/82 | HR 69 | Temp 98.2°F | Resp 14 | Ht 63.0 in | Wt 151.4 lb

## 2017-11-30 DIAGNOSIS — Z0000 Encounter for general adult medical examination without abnormal findings: Secondary | ICD-10-CM

## 2017-11-30 DIAGNOSIS — Z23 Encounter for immunization: Secondary | ICD-10-CM

## 2017-11-30 DIAGNOSIS — Z1211 Encounter for screening for malignant neoplasm of colon: Secondary | ICD-10-CM

## 2017-11-30 DIAGNOSIS — E785 Hyperlipidemia, unspecified: Secondary | ICD-10-CM

## 2017-11-30 NOTE — Patient Instructions (Addendum)
If you have lab work done today you will be contacted with your lab results within the next 2 weeks.  If you have not heard from [place] then please contact us. The fastest way to get your results is to register for My Chart.   IF you received an x-ray today, you will receive an invoice from [facility]. Please contact [facility] at [phone] with questions or concerns regarding your invoice.   IF you received labwork today, you will receive an invoice from [place]. Please contact [facility] at [phone] with questions or concerns regarding your invoice.   Our billing staff will not be able to assist you with questions regarding bills from these companies.  You will be contacted with the lab results as soon as they are available. The fastest way to get your results is to activate your My Chart account. Instructions are located on the last page of this paperwork. If you have not heard from [place] regarding the results in 2 weeks, please contact this office.        Recombinant Zoster (Shingles) Vaccine, RZV: What You Need to Know 1. Why get vaccinated? Shingles (also called herpes zoster, or just zoster) is a painful skin rash, often with blisters. Shingles is caused by the varicella zoster virus, the same virus that causes chickenpox. After you have chickenpox, the virus stays in your body and can cause shingles later in life. You can't catch shingles from another person. However, a person who has never had chickenpox (or chickenpox vaccine) could get chickenpox from someone with shingles. A shingles rash usually appears on one side of the face or body and heals within 2 to 4 weeks. Its main symptom is pain, which can be severe. Other symptoms can include fever, headache, chills and upset stomach. Very rarely, a shingles infection can lead to pneumonia, hearing problems, blindness, brain inflammation (encephalitis), or death. For about 1 person in 5, severe pain can  continue even long after the rash has cleared up. This long-lasting pain is called post-herpetic neuralgia (PHN). Shingles is far more common in people 103 years of age and older than in younger people, and the risk increases with age. It is also more common in people whose immune system is weakened because of a disease such as cancer, or by drugs such as steroids or chemotherapy. At least 1 million people a year in the [place] get shingles. 2. Shingles vaccine (recombinant) Recombinant shingles vaccine was approved by FDA in 2017 for the prevention of shingles. In clinical trials, it was more than 90% effective in preventing shingles. It can also reduce the likelihood of PHN. Two doses, 2 to 6 months apart, are recommended for adults 10 and older. This vaccine is also recommended for people who have already gotten the live shingles vaccine (Zostavax). There is no live virus in this vaccine. 3. Some people should not get this vaccine Tell your vaccine provider if you:  Have any severe, life-threatening allergies. A person who has ever had a life-threatening allergic reaction after a dose of recombinant shingles vaccine, or has a severe allergy to any component of this vaccine, may be advised not to be vaccinated. Ask your health care provider if you want information about vaccine components.  Are pregnant or breastfeeding. There is not much information about use of recombinant shingles vaccine in pregnant or nursing women. Your healthcare provider might recommend delaying vaccination.  Are not feeling well. If you have a mild illness, such  as a cold, you can probably get the vaccine today. If you are moderately or severely ill, you should probably wait until you recover. Your doctor can advise you.  4. Risks of a vaccine reaction With any medicine, including vaccines, there is a chance of reactions. After recombinant shingles vaccination, a person might experience:  Pain, redness, soreness,  or swelling at the site of the injection  Headache, muscle aches, fever, shivering, fatigue  In clinical trials, most people got a sore arm with mild or moderate pain after vaccination, and some also had redness and swelling where they got the shot. Some people felt tired, had muscle pain, a headache, shivering, fever, stomach pain, or nausea. About 1 out of 6 people who got recombinant zoster vaccine experienced side effects that prevented them from doing regular activities. Symptoms went away on their own in about 2 to 3 days. Side effects were more common in younger people. You should still get the second dose of recombinant zoster vaccine even if you had one of these reactions after the first dose. Other things that could happen after this vaccine:  People sometimes faint after medical procedures, including vaccination. Sitting or lying down for about 15 minutes can help prevent fainting and injuries caused by a fall. Tell your provider if you feel dizzy or have vision changes or ringing in the ears.  Some people get shoulder pain that can be more severe and longer-lasting than routine soreness that can follow injections. This happens very rarely.  Any medication can cause a severe allergic reaction. Such reactions to a vaccine are estimated at about 1 in a million doses, and would happen within a few minutes to a few hours after the vaccination. As with any medicine, there is a very remote chance of a vaccine causing a serious injury or death. The safety of vaccines is always being monitored. For more information, visit: http://www.aguilar.org/ 5. What if there is a serious problem? What should I look for?  Look for anything that concerns you, such as signs of a severe allergic reaction, very high fever, or unusual behavior. Signs of a severe allergic reaction can include hives, swelling of the face and throat, difficulty breathing, a fast heartbeat, dizziness, and weakness. These would  usually start a few minutes to a few hours after the vaccination. What should I do?  If you think it is a severe allergic reaction or other emergency that can't wait, call 9-1-1 and get to the nearest hospital. Otherwise, call your health care provider. Afterward, the reaction should be reported to the Vaccine Adverse Event Reporting System (VAERS). Your doctor should file this report, or you can do it yourself through the VAERS web site atwww.vaers.https://www.bray.com/ by calling 236-435-4259. VAERS does not give medical advice. 6. How can I learn more?  Ask your healthcare provider. He or she can give you the vaccine package insert or suggest other sources of information.  Call your local or state health department.  Contact the Centers for Disease Control and Prevention (CDC): ? Call 218-260-8285 (1-800-CDC-INFO) or ? Visit the CDC's website at http://hunter.com/ CDC Vaccine Information Statement (VIS) Recombinant Zoster Vaccine (03/22/2016) This information is not intended to replace advice given to you by your health care provider. Make sure you discuss any questions you have with your health care provider. Document Released: 04/06/2016 Document Revised: 04/06/2016 Document Reviewed: 04/06/2016 Elsevier Interactive Patient Education  [employer].

## 2017-12-01 LAB — COMPREHENSIVE METABOLIC PANEL
ALT: 18 IU/L (ref 0–32)
AST: 19 IU/L (ref 0–40)
Albumin/Globulin Ratio: 1.8 (ref 1.2–2.2)
Albumin: 4.4 g/dL (ref 3.6–4.8)
Alkaline Phosphatase: 79 IU/L (ref 39–117)
BUN/Creatinine Ratio: 27 (ref 12–28)
BUN: 20 mg/dL (ref 8–27)
Bilirubin Total: 0.7 mg/dL (ref 0.0–1.2)
CO2: 25 mmol/L (ref 20–29)
Calcium: 9.7 mg/dL (ref 8.7–10.3)
Chloride: 101 mmol/L (ref 96–106)
Creatinine, Ser: 0.75 mg/dL (ref 0.57–1.00)
GFR calc Af Amer: 95 mL/min/{1.73_m2} (ref >59)
GFR calc non Af Amer: 82 mL/min/{1.73_m2} (ref >59)
Globulin, Total: 2.5 g/dL (ref 1.5–4.5)
Glucose: 88 mg/dL (ref 65–99)
Potassium: 4.1 mmol/L (ref 3.5–5.2)
Sodium: 142 mmol/L (ref 134–144)
Total Protein: 6.9 g/dL (ref 6.0–8.5)

## 2017-12-01 LAB — LIPID PANEL
Chol/HDL Ratio: 2.2 ratio (ref 0.0–4.4)
Cholesterol, Total: 224 mg/dL — ABNORMAL HIGH (ref 100–199)
HDL: 101 mg/dL (ref >39)
LDL Calculated: 106 mg/dL — ABNORMAL HIGH (ref 0–99)
Triglycerides: 85 mg/dL (ref 0–149)
VLDL Cholesterol Cal: 17 mg/dL (ref 5–40)

## 2017-12-07 ENCOUNTER — Ambulatory Visit: Payer: Medicare Other | Admitting: Physical Therapy

## 2017-12-07 ENCOUNTER — Encounter: Payer: Self-pay | Admitting: Physical Therapy

## 2017-12-07 DIAGNOSIS — R600 Localized edema: Secondary | ICD-10-CM | POA: Diagnosis not present

## 2017-12-07 DIAGNOSIS — G8929 Other chronic pain: Secondary | ICD-10-CM

## 2017-12-07 DIAGNOSIS — M25512 Pain in left shoulder: Principal | ICD-10-CM

## 2017-12-07 NOTE — Therapy (Signed)
[place], [place], [address] Phone: [phone]   Fax:  [phone]  Physical Therapy Treatment / Discharge Summary  Patient Details  Name: Kathleen Douglas MRN: [id number] Date of Birth: April 30, 1950 Referring Provider (PT): Joni Fears, MD   Encounter Date: 12/07/2017  PT End of Session - 12/07/17 1016    Visit Number  5    Number of Visits  12    Date for PT Re-Evaluation  11/29/17    Authorization Type  MCR    PT Start Time  1015    PT Stop Time  1040    PT Time Calculation (min)  25 min    Activity Tolerance  Patient tolerated treatment well    Behavior During Therapy  [facility] for tasks assessed/performed       Past Medical History:  Diagnosis Date  . Osteopenia   . Vitamin D deficiency     Past Surgical History:  Procedure Laterality Date  . ABDOMINAL HYSTERECTOMY    . BREAST BIOPSY Left    Excisional   . TUBAL LIGATION      There were no vitals filed for this visit.  Subjective Assessment - 12/07/17 1015    Subjective  "I have been doing much better in the hsoudler and some sorenes in the wrist but is improving'     Patient Stated Goals  be able to sleep better    Currently in Pain?  No/denies    Pain Frequency  Occasional    Aggravating Factors   NA         [facility] PT Assessment - 12/07/17 1016      Assessment   Medical Diagnosis  Lt shldr impingment    Referring Provider (PT)  Joni Fears, MD      Observation/Other Assessments   Focus on Therapeutic Outcomes (FOTO)   22% limited      AROM   Left Shoulder Flexion  158 Degrees    Left Shoulder ABduction  158 Degrees      Strength   Left Shoulder Flexion  4+/5    Left Shoulder ABduction  4/5    Left Shoulder Internal Rotation  5/5    Left Shoulder External Rotation  5/5                   [facility] Adult PT Treatment/Exercise - 12/07/17 0001      Shoulder Exercises: Standing   External Rotation   Strengthening;15 reps;Left;Theraband    Theraband Level (Shoulder External Rotation)  Level 2 (Red)    Internal Rotation  Strengthening;15 reps;Left;Theraband    Theraband Level (Shoulder Internal Rotation)  Level 2 (Red)    Row  Strengthening;Both;15 reps;Theraband    Theraband Level (Shoulder Row)  Level 3 (Green)      Manual Therapy   Manual therapy comments  MTPR along the infraspinatus/ rhomboids and upper trap             PT Education - 12/07/17 1042    Education Details  reviewed previously provided HEP and importance of consistency with exercises. How to progress with increased reps/ sets and endurance training.     Person(s) Educated  Patient    Methods  Explanation;Verbal cues;Handout    Comprehension  Verbalized understanding;Verbal cues required          PT Long Term Goals - 12/07/17 1021      PT LONG TERM GOAL #1   Title  Pt will be I and compliant  with HEP. 6 weeks 11/29/17    Period  Weeks    Status  Achieved      PT LONG TERM GOAL #2   Title  Pt will improve Lt shoulder AROM to [facility] at least 160 deg flexion and abd. 6 weeks 11/29/17    Period  Weeks    Status  Achieved      PT LONG TERM GOAL #3   Title  Pt will improve Lt shoulder strength to at least 4+/5 MMT. 6 weeks 11/29/17    Baseline  abduction 4/5    Period  Weeks    Status  Partially Met      PT LONG TERM GOAL #4   Title  Pt will improve FOTO to less than 29% limited. 6 weeks 11/29/17    Status  Achieved      PT LONG TERM GOAL #5   Title  Pt will improve sleep quality by having less shoulder pain and discomfort sleeping. 6 weeks 11/29/17    Period  Weeks    Status  Achieved            Plan - 12/07/17 1037    Clinical Impression Statement  Mrs. Bridge has made great progress with physical therapy improving shoulder mobility and strength. Reviewed previously provided HEP and how to progress. She was able to do all exercises with no report of pain or soreness. she met or partially  met all goals and will be able to maintain and progress her current level of function independently    PT Treatment/Interventions  Cryotherapy;Electrical Stimulation;Iontophoresis 11m/ml Dexamethasone;Traction;Ultrasound;Therapeutic exercise;Neuromuscular re-education;Patient/family education;Manual techniques;Passive range of motion;Dry needling;Taping;Vasopneumatic Device    PT Next Visit Plan  D/C    PT Home Exercise Plan  rows, IR/ ER, posterior capsule stretching, wall flexion stretch, pec stretch, wall push up, lower trap y's, wall wash    Consulted and Agree with Plan of Care  Patient       Patient will benefit from skilled therapeutic intervention in order to improve the following deficits and impairments:  Decreased activity tolerance, Decreased range of motion, Decreased strength, Increased fascial restricitons, Pain, Postural dysfunction  Visit Diagnosis: Chronic left shoulder pain  Localized edema     Problem List Patient Active Problem List   Diagnosis Date Noted  . Dyslipidemia 03/03/2016  . Vitamin D deficiency 03/03/2016    LStarr Lake10/30/2019, 10:47 AM  [facility] [address]Phone: [phone]  Fax:  [phone] Name: Kathleen AraizaMRN: [id number] of Birth: 809-May-1952      PHYSICAL THERAPY DISCHARGE SUMMARY  Visits from Start of Care: 5  Current functional level related to goals / functional outcomes: See goals, FOTO 22% limited    Remaining deficits: N/A   Education / Equipment: HEP, theraband, posture, anatomy   Plan: Patient agrees to discharge.  Patient goals were partially met. Patient is being discharged due to not returning since the last visit.  ?????        Vora Clover PT, DPT, LAT, ATC  12/07/17  10:48 AM

## 2017-12-26 ENCOUNTER — Ambulatory Visit (AMBULATORY_SURGERY_CENTER): Payer: Self-pay | Admitting: *Deleted

## 2017-12-26 ENCOUNTER — Telehealth: Payer: Self-pay | Admitting: *Deleted

## 2017-12-26 VITALS — Ht 63.0 in | Wt 154.0 lb

## 2017-12-26 DIAGNOSIS — Z1211 Encounter for screening for malignant neoplasm of colon: Secondary | ICD-10-CM

## 2017-12-26 MED ORDER — NA SULFATE-K SULFATE-MG SULF 17.5-3.13-1.6 GM/177ML PO SOLN: ORAL | 0 refills | Status: DC

## 2017-12-26 NOTE — Telephone Encounter (Signed)
Patient is for direct screening colonoscopy with Dr.Stark on 01/09/18. During PV today she states her last colonoscopy was with Dr.Martin Wynetta Emery and she thinks she had polyps removed. Release of information formed filled out and signs by the patient, this was given to [place], [place].

## 2017-12-26 NOTE — Telephone Encounter (Signed)
Received fax from [facility] stating they have no record of patient.

## 2017-12-26 NOTE — Telephone Encounter (Signed)
Patient's colon was in 2004 at [facility] and they do not have records any longer. Patient states she may have the record at home and will bring it with her to her Colonoscopy.

## 2017-12-26 NOTE — Telephone Encounter (Signed)
Faxed [facility] to Dr. Durenda Age office.

## 2017-12-26 NOTE — Progress Notes (Signed)
Patient denies any allergies to eggs or soy. Patient denies any problems with anesthesia/sedation. Patient denies any oxygen use at home. Patient denies taking any diet/weight loss medications or blood thinners. EMMI education assisgned to patient on colonoscopy, this was explained and instructions given to patient. Release of information signed by pt. Last colon 2004.

## 2018-01-02 ENCOUNTER — Telehealth: Payer: Self-pay | Admitting: Gastroenterology

## 2018-01-02 DIAGNOSIS — Z1211 Encounter for screening for malignant neoplasm of colon: Secondary | ICD-10-CM

## 2018-01-02 MED ORDER — PEG 3350-KCL-NA BICARB-NACL 420 G PO SOLR: 4000.0000 mL | Freq: Once | ORAL | 0 refills | Status: AC

## 2018-01-02 NOTE — Telephone Encounter (Signed)
Spoke with patient. She wants cheaper prep. Golytely sent to pharmacy and new instructions sent to MyChart and mailed to pt. Pt is aware. Patient will call with any questions.

## 2018-01-02 NOTE — Telephone Encounter (Signed)
No answer, left message for pt to call us back.

## 2018-01-02 NOTE — Telephone Encounter (Signed)
Patient states suprep for colon on 12.2.19 is too expensive at $48 and wants to know if something else cheaper can be called in. Pt had pv on 11.18.19. Pt also states she could not locate her previous colon report.

## 2018-01-09 ENCOUNTER — Encounter: Payer: Self-pay | Admitting: Gastroenterology

## 2018-01-09 ENCOUNTER — Ambulatory Visit (AMBULATORY_SURGERY_CENTER): Payer: Medicare Other | Admitting: Gastroenterology

## 2018-01-09 VITALS — BP 131/81 | HR 80 | Temp 97.5°F | Resp 15 | Ht 63.0 in | Wt 154.0 lb

## 2018-01-09 DIAGNOSIS — Z1211 Encounter for screening for malignant neoplasm of colon: Secondary | ICD-10-CM

## 2018-01-09 DIAGNOSIS — D125 Benign neoplasm of sigmoid colon: Secondary | ICD-10-CM

## 2018-01-09 MED ORDER — SODIUM CHLORIDE 0.9 % IV SOLN: 500.0000 mL | Freq: Once | INTRAVENOUS | Status: DC

## 2018-01-09 NOTE — Progress Notes (Signed)
Called to room to assist during endoscopic procedure.  Patient ID and intended procedure confirmed with present staff. Received instructions for my participation in the procedure from the performing physician.  

## 2018-01-09 NOTE — Progress Notes (Signed)
PT taken to PACU. Monitors in place. VSS. Report given to RN. 

## 2018-01-09 NOTE — Progress Notes (Signed)
Pt's states no medical or surgical changes since previsit or office visit.Pt's states no medical or surgical changes since previsit or office visit. 

## 2018-01-09 NOTE — Op Note (Signed)
[place] Patient Name: Kathleen Douglas Procedure Date: 01/09/2018 8:02 AM MRN: [id number] Endoscopist: Ladene Artist , MD Age: 67 Referring MD:  Date of Birth: 1950/06/01 Gender: Female Account #: [id number] Procedure:                Colonoscopy Indications:              Screening for colorectal malignant neoplasm Medicines:                Monitored Anesthesia Care Procedure:                Pre-Anesthesia Assessment:                           - Prior to the procedure, a History and Physical                            was performed, and patient medications and                            allergies were reviewed. The patient's tolerance of                            previous anesthesia was also reviewed. The risks                            and benefits of the procedure and the sedation                            options and risks were discussed with the patient.                            All questions were answered, and informed consent                            was obtained. Prior Anticoagulants: The patient has                            taken no previous anticoagulant or antiplatelet                            agents. ASA Grade Assessment: II - A patient with                            mild systemic disease. After reviewing the risks                            and benefits, the patient was deemed in                            satisfactory condition to undergo the procedure.                           After obtaining informed consent, the colonoscope  was passed under direct vision. Throughout the                            procedure, the patient's blood pressure, pulse, and                            oxygen saturations were monitored continuously. The                            Colonoscope was introduced through the anus and                            advanced to the the cecum, identified by                            appendiceal orifice and  ileocecal valve. The                            ileocecal valve, appendiceal orifice, and rectum                            were photographed. The quality of the bowel                            preparation was excellent. The colonoscopy was                            performed without difficulty. The patient tolerated                            the procedure well. Scope In: 8:09:41 AM Scope Out: 8:23:25 AM Scope Withdrawal Time: 0 hours 10 minutes 45 seconds  Total Procedure Duration: 0 hours 13 minutes 44 seconds  Findings:                 The perianal and digital rectal examinations were                            normal.                           A 7 mm polyp was found in the sigmoid colon. The                            polyp was semi-pedunculated. The polyp was removed                            with a cold snare. Resection and retrieval were                            complete.                           The exam was otherwise without abnormality on  direct and retroflexion views. Complications:            No immediate complications. Estimated blood loss:                            None. Estimated Blood Loss:     Estimated blood loss: none. Impression:               - One 7 mm polyp in the sigmoid colon, removed with                            a cold snare. Resected and retrieved.                           - The examination was otherwise normal on direct                            and retroflexion views. Recommendation:           - Repeat colonoscopy in 5 years for surveillance if                            polyp is precancerous, otherwise 10 years.                           - Patient has a contact number available for                            emergencies. The signs and symptoms of potential                            delayed complications were discussed with the                            patient. Return to normal activities tomorrow.                             Written discharge instructions were provided to the                            patient.                           - Resume previous diet.                           - Continue present medications. Ladene Artist, MD 01/09/2018 8:29:08 AM This report has been signed electronically.

## 2018-01-09 NOTE — Patient Instructions (Signed)
Impression/Recommendations:  Polyp handout given to patient.  Repeat colonoscopy in for surveillance.  Date to be determined after pathology results reviewed.  Resume previous diet. Continue present medications.  YOU HAD AN ENDOSCOPIC PROCEDURE TODAY AT [facility]:   Refer to the procedure report that was given to you for any specific questions about what was found during the examination.  If the procedure report does not answer your questions, please call your gastroenterologist to clarify.  If you requested that your care partner not be given the details of your procedure findings, then the procedure report has been included in a sealed envelope for you to review at your convenience later.  YOU SHOULD EXPECT: Some feelings of bloating in the abdomen. Passage of more gas than usual.  Walking can help get rid of the air that was put into your GI tract during the procedure and reduce the bloating. If you had a lower endoscopy (such as a colonoscopy or flexible sigmoidoscopy) you may notice spotting of blood in your stool or on the toilet paper. If you underwent a bowel prep for your procedure, you may not have a normal bowel movement for a few days.  Please Note:  You might notice some irritation and congestion in your nose or some drainage.  This is from the oxygen used during your procedure.  There is no need for concern and it should clear up in a day or so.  SYMPTOMS TO REPORT IMMEDIATELY:   Following lower endoscopy (colonoscopy or flexible sigmoidoscopy):  Excessive amounts of blood in the stool  Significant tenderness or worsening of abdominal pains  Swelling of the abdomen that is new, acute  Fever of 100F or higher For urgent or emergent issues, a gastroenterologist can be reached at any hour by calling [phone].   DIET:  We do recommend a small meal at first, but then you may proceed to your regular diet.  Drink plenty of fluids but you should avoid  alcoholic beverages for 24 hours.  ACTIVITY:  You should plan to take it easy for the rest of today and you should NOT DRIVE or use heavy machinery until tomorrow (because of the sedation medicines used during the test).    FOLLOW UP: Our staff will call the number listed on your records the next business day following your procedure to check on you and address any questions or concerns that you may have regarding the information given to you following your procedure. If we do not reach you, we will leave a message.  However, if you are feeling well and you are not experiencing any problems, there is no need to return our call.  We will assume that you have returned to your regular daily activities without incident.  If any biopsies were taken you will be contacted by phone or by letter within the next 1-3 weeks.  Please call us at [phone] if you have not heard about the biopsies in 3 weeks.    SIGNATURES/CONFIDENTIALITY: You and/or your care partner have signed paperwork which will be entered into your electronic medical record.  These signatures attest to the fact that that the information above on your After Visit Summary has been reviewed and is understood.  Full responsibility of the confidentiality of this discharge information lies with you and/or your care-partner.

## 2018-01-10 ENCOUNTER — Telehealth: Payer: Self-pay | Admitting: *Deleted

## 2018-01-10 ENCOUNTER — Telehealth: Payer: Self-pay

## 2018-01-10 NOTE — Telephone Encounter (Signed)
Second follow up call attempt.  Message left to call if any questions or concerns. 

## 2018-01-10 NOTE — Telephone Encounter (Signed)
NO ANSWER, MESSAGE LEFT FOR PATIENT. 

## 2018-01-16 ENCOUNTER — Encounter: Payer: Self-pay | Admitting: Gastroenterology

## 2018-01-20 ENCOUNTER — Telehealth: Payer: Self-pay | Admitting: Family Medicine

## 2018-01-20 NOTE — Telephone Encounter (Signed)
mychart message sent to pt about rescheduling their apt on 06/01/18

## 2018-06-01 ENCOUNTER — Ambulatory Visit: Payer: Medicare Other | Admitting: Family Medicine

## 2018-06-02 ENCOUNTER — Other Ambulatory Visit: Payer: Self-pay

## 2018-06-02 ENCOUNTER — Encounter: Payer: Self-pay | Admitting: Family Medicine

## 2018-06-02 ENCOUNTER — Telehealth (INDEPENDENT_AMBULATORY_CARE_PROVIDER_SITE_OTHER): Payer: Medicare Other | Admitting: Family Medicine

## 2018-06-02 VITALS — BP 130/87 | Wt 150.0 lb

## 2018-06-02 DIAGNOSIS — E559 Vitamin D deficiency, unspecified: Secondary | ICD-10-CM

## 2018-06-02 DIAGNOSIS — E785 Hyperlipidemia, unspecified: Secondary | ICD-10-CM | POA: Diagnosis not present

## 2018-06-02 DIAGNOSIS — R21 Rash and other nonspecific skin eruption: Secondary | ICD-10-CM | POA: Diagnosis not present

## 2018-06-02 NOTE — Progress Notes (Signed)
Telemedicine Encounter- SOAP NOTE Established Patient  This telephone encounter was conducted with the patient's (or proxy's) verbal consent via audio telecommunications: yes/no: Yes Patient was instructed to have this encounter in a suitably private space; and to only have persons present to whom they give permission to participate. In addition, patient identity was confirmed by use of name plus two identifiers (DOB and address).  I discussed the limitations, risks, security and privacy concerns of performing an evaluation and management service by telephone and the availability of in person appointments. I also discussed with the patient that there may be a patient responsible charge related to this service. The patient expressed understanding and agreed to proceed.  I spent a total of TIME; 0 MIN TO 60 MIN: 25 minutes talking with the patient or their proxy.  CC: skin rash  Subjective   Kathleen Douglas is a 68 y.o. established patient. 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 Vanessa Kick She is concerned that stress might be aggravating it She took pictures and will upload pictures to mychart.  Dyslipidemia She is not taking any additional supplements such as fiber She is walking 5 times a week She is taking a Acupuncturist Course for [employer] She states that she is working on modification of her diet and lifestyle  Lab Results  Component Value Date   CHOL 224 (H) 11/30/2017   CHOL 230 (H) 02/18/2016   CHOL 227 (H) 02/21/2012   Lab Results  Component Value Date   HDL 101  11/30/2017   HDL 101 02/18/2016   HDL 105 02/21/2012   Lab Results  Component Value Date   LDLCALC 106 (H) 11/30/2017   LDLCALC 114 (H) 02/18/2016   LDLCALC 112 (H) 02/21/2012   Lab Results  Component Value Date   TRIG 85 11/30/2017   TRIG 73 02/18/2016   TRIG 51 02/21/2012   Lab Results  Component Value Date   CHOLHDL 2.2 11/30/2017   CHOLHDL 2.3 02/18/2016   CHOLHDL 2.2 02/21/2012   No results found for: LDLDIRECT   Patient Active Problem List   Diagnosis Date Noted  . Dyslipidemia 03/03/2016  . Vitamin D deficiency 03/03/2016    Past Medical History:  Diagnosis Date  . Osteopenia   . Vitamin D deficiency     Current Outpatient Medications  Medication Sig Dispense Refill  . Calcium Carbonate-Vitamin D (CALCIUM + D PO) Take 4 tablets by mouth daily. Reported on 06/05/2015    . multivitamin-iron-minerals-folic acid (CENTRUM) chewable tablet Chew 1 tablet by mouth daily.    Marland Kitchen loratadine (CLARITIN) 10 MG tablet Take 1 tablet (10 mg total) by mouth daily. 30 tablet 11  . permethrin (ELIMITE) 5 % cream Thoroughly massage cream (30 g for average adult) from head to soles of feet; leave on for 8 to 14 hours before removing (shower or bath) 60 g 0  . triamcinolone cream (KENALOG) 0.1 % Apply 1 application topically 2 (two) times daily. 30 g 0   No current facility-administered medications for this visit.  No Known Allergies  Social History   Socioeconomic History  . Marital status: Married    Spouse name: Not on file  . Number of children: Not on file  . Years of education: Not on file  . Highest education level: Not on file  Occupational History  . Not on file  Social Needs  . Financial resource strain: Not on file  . Food insecurity:    Worry: Not on file    Inability: Not on file  . Transportation needs:    Medical: Not on file    Non-medical: Not on file  Tobacco Use  . Smoking status: Never Smoker  . Smokeless tobacco: Never Used  Substance and  Sexual Activity  . Alcohol use: Yes    Comment: socially, occ.  . Drug use: No  . Sexual activity: Yes    Birth control/protection: None  Lifestyle  . Physical activity:    Days per week: Not on file    Minutes per session: Not on file  . Stress: Not on file  Relationships  . Social connections:    Talks on phone: Not on file    Gets together: Not on file    Attends religious service: Not on file    Active member of club or organization: Not on file    Attends meetings of clubs or organizations: Not on file    Relationship status: Not on file  . Intimate partner violence:    Fear of current or ex partner: Not on file    Emotionally abused: Not on file    Physically abused: Not on file    Forced sexual activity: Not on file  Other Topics Concern  . Not on file  Social History Narrative  . Not on file    ROS Review of Systems  Constitutional: Negative for activity change, appetite change, chills and fever.  HENT: Negative for congestion, nosebleeds, trouble swallowing and voice change.   Respiratory: Negative for cough, shortness of breath and wheezing.   Gastrointestinal: Negative for diarrhea, nausea and vomiting.  Genitourinary: Negative for difficulty urinating, dysuria, flank pain and hematuria.  Musculoskeletal: Negative for back pain, joint swelling and neck pain.  Neurological: Negative for dizziness, speech difficulty, light-headedness and numbness.  See HPI. All other review of systems negative.   Objective   Vitals as reported by the patient: Today's Vitals   06/02/18 1059  BP: 130/87  Weight: 150 lb (68 kg)          Diagnoses and all orders for this visit:  Dyslipidemia- discussed lipid mgmt She should continue a healthy diet  -     CMP14+EGFR; Future -     Lipid panel; Future  Vitamin D deficiency- continue vitamin D supplementation -     CMP14+EGFR; Future -     VITAMIN D 25 Hydroxy (Vit-D Deficiency, Fractures); Future  Rash - reviewed  images of rash from mychart and will do trial of permethrin followed by topical steroid and antihistamine   I discussed the assessment and treatment plan with the patient. The patient was provided an opportunity to ask questions and all were answered. The patient agreed with the plan and demonstrated an understanding of the instructions.   The patient was advised to call back or seek an in-person evaluation if the symptoms worsen or if the condition fails to improve as anticipated.  I provided 25 minutes of non-face-to-face time during this encounter.  Forrest Moron, MD  Primary Care at [facility]

## 2018-06-02 NOTE — Progress Notes (Signed)
Contacted and triage patient for appointment. Patient is following up 6 month on Dyslipidemia. Patient complaining of rash behind left ear with itching and pain since 05/04/2018. Patient wants a referral to  Dermatology because she has a rash on her back. Patient will explain in detail at [facility] visit. Patient states blood pressure reading this morning was 130/87. I reviewed health maintenance colonoscopy and patient is up to date. Patient stated when she had the colonoscopy they found polyps and someone she knew died from colon cancer. Patient is eating healthy and I advised her not to eat a lot of processed foods or meats and eat more vegetables.

## 2018-06-05 MED ORDER — LORATADINE 10 MG PO TABS: 10.0000 mg | ORAL_TABLET | Freq: Every day | ORAL | 11 refills | Status: DC

## 2018-06-05 MED ORDER — PERMETHRIN 5 % EX CREA: TOPICAL_CREAM | CUTANEOUS | 0 refills | Status: DC

## 2018-06-05 MED ORDER — TRIAMCINOLONE ACETONIDE 0.1 % EX CREA: 1.0000 "application " | TOPICAL_CREAM | Freq: Two times a day (BID) | CUTANEOUS | 0 refills | Status: DC

## 2018-06-06 NOTE — Progress Notes (Signed)
Per [facility] left detailed message that dr. Nolon Rod sent in a topical cream called permethrin for a one time only use and then to start topical cream to the affected area.  Advised to use claritin daily to stop with the itching.  Advised there is a possibility that it is scabies and if the itching improves or does improve to call office for appt.   Dgaddy, CMA

## 2018-07-31 DIAGNOSIS — L281 Prurigo nodularis: Secondary | ICD-10-CM | POA: Diagnosis not present

## 2018-07-31 DIAGNOSIS — L218 Other seborrheic dermatitis: Secondary | ICD-10-CM | POA: Diagnosis not present

## 2018-08-01 ENCOUNTER — Ambulatory Visit (INDEPENDENT_AMBULATORY_CARE_PROVIDER_SITE_OTHER): Payer: Medicare Other | Admitting: Family Medicine

## 2018-08-01 ENCOUNTER — Other Ambulatory Visit: Payer: Self-pay

## 2018-08-01 DIAGNOSIS — E785 Hyperlipidemia, unspecified: Secondary | ICD-10-CM | POA: Diagnosis not present

## 2018-08-01 DIAGNOSIS — E559 Vitamin D deficiency, unspecified: Secondary | ICD-10-CM

## 2018-08-02 LAB — CMP14+EGFR
ALT: 19 IU/L (ref 0–32)
AST: 22 IU/L (ref 0–40)
Albumin/Globulin Ratio: 1.9 (ref 1.2–2.2)
Albumin: 4.5 g/dL (ref 3.8–4.8)
Alkaline Phosphatase: 79 IU/L (ref 39–117)
BUN/Creatinine Ratio: 30 — ABNORMAL HIGH (ref 12–28)
BUN: 24 mg/dL (ref 8–27)
Bilirubin Total: 0.5 mg/dL (ref 0.0–1.2)
CO2: 24 mmol/L (ref 20–29)
Calcium: 9.6 mg/dL (ref 8.7–10.3)
Chloride: 100 mmol/L (ref 96–106)
Creatinine, Ser: 0.8 mg/dL (ref 0.57–1.00)
GFR calc Af Amer: 88 mL/min/{1.73_m2} (ref >59)
GFR calc non Af Amer: 76 mL/min/{1.73_m2} (ref >59)
Globulin, Total: 2.4 g/dL (ref 1.5–4.5)
Glucose: 85 mg/dL (ref 65–99)
Potassium: 4.4 mmol/L (ref 3.5–5.2)
Sodium: 142 mmol/L (ref 134–144)
Total Protein: 6.9 g/dL (ref 6.0–8.5)

## 2018-08-02 LAB — LIPID PANEL
Chol/HDL Ratio: 2.1 ratio (ref 0.0–4.4)
Cholesterol, Total: 225 mg/dL — ABNORMAL HIGH (ref 100–199)
HDL: 105 mg/dL (ref >39)
LDL Calculated: 108 mg/dL — ABNORMAL HIGH (ref 0–99)
Triglycerides: 59 mg/dL (ref 0–149)
VLDL Cholesterol Cal: 12 mg/dL (ref 5–40)

## 2018-08-02 LAB — VITAMIN D 25 HYDROXY (VIT D DEFICIENCY, FRACTURES): Vit D, 25-Hydroxy: 26.9 ng/mL — ABNORMAL LOW (ref 30.0–100.0)

## 2018-08-16 NOTE — Progress Notes (Signed)
Lab visit only. 

## 2018-09-27 ENCOUNTER — Other Ambulatory Visit: Payer: Self-pay

## 2018-09-27 ENCOUNTER — Telehealth (INDEPENDENT_AMBULATORY_CARE_PROVIDER_SITE_OTHER): Payer: Medicare Other | Admitting: Family Medicine

## 2018-09-27 DIAGNOSIS — R05 Cough: Secondary | ICD-10-CM

## 2018-09-27 DIAGNOSIS — R6889 Other general symptoms and signs: Secondary | ICD-10-CM | POA: Diagnosis not present

## 2018-09-27 DIAGNOSIS — Z20822 Contact with and (suspected) exposure to covid-19: Secondary | ICD-10-CM

## 2018-09-27 DIAGNOSIS — R059 Cough, unspecified: Secondary | ICD-10-CM

## 2018-09-27 DIAGNOSIS — Z20828 Contact with and (suspected) exposure to other viral communicable diseases: Secondary | ICD-10-CM

## 2018-09-27 NOTE — Progress Notes (Signed)
Virtual Visit via Telephone Note  I connected with Kathleen Douglas on 09/27/18 at 11:09 AM by telephone and verified that I am speaking with the correct person using two identifiers.   I discussed the limitations, risks, security and privacy concerns of performing an evaluation and management service by telephone and the availability of in person appointments. I also discussed with the patient that there may be a patient responsible charge related to this service. The patient expressed understanding and agreed to proceed, consent obtained  Chief complaint:  covid 19 testing.   History of Present Illness: Kathleen Douglas is a 68 y.o. female  Concern for covid-19 test.  Husband with possible exposure. About a week ago.  He is being tested. She has been sleeping in separate room.    Did have a cough that started about 8/14. Dry cough, about the same past few days.  Some sneezing this am.  Checking temp which has been normal. No fevers.  No loss of taste/smell, no dyspnea, no myalgias,  No n/v/d.  Has continued walking without DOE.     Patient Active Problem List   Diagnosis Date Noted  . Dyslipidemia 03/03/2016  . Vitamin D deficiency 03/03/2016   Past Medical History:  Diagnosis Date  . Osteopenia   . Vitamin D deficiency    Past Surgical History:  Procedure Laterality Date  . ABDOMINAL HYSTERECTOMY    . BREAST BIOPSY Left    Excisional   . COLONOSCOPY  2004   Dr.Martin Wynetta Emery  . TUBAL LIGATION     No Known Allergies Prior to Admission medications   Medication Sig Start Date End Date Taking? Authorizing Provider  Calcium Carbonate-Vitamin D (CALCIUM + D PO) Take 4 tablets by mouth daily. Reported on 06/05/2015   Yes [provider]  loratadine (CLARITIN) 10 MG tablet Take 1 tablet (10 mg total) by mouth daily. 06/05/18  Yes Forrest Moron, MD  multivitamin-iron-minerals-folic acid (CENTRUM) chewable tablet Chew 1 tablet by mouth daily.   Yes [provider]  permethrin (ELIMITE) 5 % cream Thoroughly massage cream (30 g for average adult) from head to soles of feet; leave on for 8 to 14 hours before removing (shower or bath) 06/05/18  Yes Stallings, Zoe A, MD  triamcinolone cream (KENALOG) 0.1 % Apply 1 application topically 2 (two) times daily. 06/05/18  Yes Forrest Moron, MD   Social History   Socioeconomic History  . Marital status: Married    Spouse name: Not on file  . Number of children: Not on file  . Years of education: Not on file  . Highest education level: Not on file  Occupational History  . Not on file  Social Needs  . Financial resource strain: Not on file  . Food insecurity    Worry: Not on file    Inability: Not on file  . Transportation needs    Medical: Not on file    Non-medical: Not on file  Tobacco Use  . Smoking status: Never Smoker  . Smokeless tobacco: Never Used  Substance and Sexual Activity  . Alcohol use: Yes    Comment: socially, occ.  . Drug use: No  . Sexual activity: Yes    Birth control/protection: None  Lifestyle  . Physical activity    Days per week: Not on file    Minutes per session: Not on file  . Stress: Not on file  Relationships  . Social connections    Talks on phone: Not on  file    Gets together: Not on file    Attends religious service: Not on file    Active member of club or organization: Not on file    Attends meetings of clubs or organizations: Not on file    Relationship status: Not on file  . Intimate partner violence    Fear of current or ex partner: Not on file    Emotionally abused: Not on file    Physically abused: Not on file    Forced sexual activity: Not on file  Other Topics Concern  . Not on file  Social History Narrative  . Not on file     Observations/Objective: Speaking in full sentences, no distress.  No cough during telephone call. There were no vitals filed for this visit.   Assessment and Plan: Close Exposure to Covid-19 Virus -  Plan:  Cough - Plan:   -Possible exposure through husband's work.  Minimal cough as only symptom, and with sneezing possible allergic cause.  Again minimal symptoms.  -Check COVID testing at drive up testing facility.  Self isolation discussed until negative results.  RTC precautions/ER precautions if more symptomatic  Follow Up Instructions:  Patient Instructions  Although your symptoms are less likely due to COVID, I did order that testing to be performed at the drive up center at the [facility] or former [facility] location.  That test may take up to 7 days for results.  I do recommend self isolation as much as possible and wearing mask at all times outside the home until you have received those results.  If any new symptoms please let me know.  Take care.    I discussed the assessment and treatment plan with the patient. The patient was provided an opportunity to ask questions and all were answered. The patient agreed with the plan and demonstrated an understanding of the instructions.   The patient was advised to call back or seek an in-person evaluation if the symptoms worsen or if the condition fails to improve as anticipated.  I provided 6 minutes of non-face-to-face time during this encounter.  Signed,   Merri Ray, MD [facility] [place].  09/27/18

## 2018-09-27 NOTE — Progress Notes (Signed)
CC- Covid testing- She had a cough 09/25/18.  No other symptoms and just want to get tested.

## 2018-09-27 NOTE — Patient Instructions (Signed)
Although your symptoms are less likely due to COVID, I did order that testing to be performed at the drive up center at the [facility] or former [facility] location.  That test may take up to 7 days for results.  I do recommend self isolation as much as possible and wearing mask at all times outside the home until you have received those results.  If any new symptoms please let me know.  Take care.

## 2018-09-29 LAB — NOVEL CORONAVIRUS, NAA: SARS-CoV-2, NAA: NOT DETECTED

## 2018-10-12 ENCOUNTER — Telehealth: Payer: Self-pay | Admitting: Family Medicine

## 2018-10-12 NOTE — Telephone Encounter (Signed)
Patient's spouse has recently been treated for COVID.  He did report on my visit with him today that she may need testing and potentially has new symptoms.  I did a telemed visit with her August 19 and negative testing at that time. Can we please reach out to her to evaluate for need for telemed visit if symptomatic or repeat testing to be ordered. Let me know if there are questions.   -JG

## 2018-10-13 NOTE — Telephone Encounter (Signed)
Pt reports direct exposure to COVID.  Initially tested negative now developed a cough.  Wants to be safe.  No appts available next week.  Advised pt of [facility] test site with no order needed.

## 2018-10-17 ENCOUNTER — Other Ambulatory Visit: Payer: Self-pay

## 2018-10-17 DIAGNOSIS — Z20822 Contact with and (suspected) exposure to covid-19: Secondary | ICD-10-CM

## 2018-10-17 DIAGNOSIS — R6889 Other general symptoms and signs: Secondary | ICD-10-CM | POA: Diagnosis not present

## 2018-10-19 LAB — NOVEL CORONAVIRUS, NAA: SARS-CoV-2, NAA: NOT DETECTED

## 2018-10-24 ENCOUNTER — Other Ambulatory Visit: Payer: Self-pay | Admitting: Family Medicine

## 2018-10-24 DIAGNOSIS — Z1231 Encounter for screening mammogram for malignant neoplasm of breast: Secondary | ICD-10-CM

## 2018-10-31 ENCOUNTER — Encounter: Payer: Self-pay | Admitting: Adult Health Nurse Practitioner

## 2018-10-31 ENCOUNTER — Telehealth (INDEPENDENT_AMBULATORY_CARE_PROVIDER_SITE_OTHER): Payer: Medicare Other | Admitting: Adult Health Nurse Practitioner

## 2018-10-31 DIAGNOSIS — R05 Cough: Secondary | ICD-10-CM

## 2018-10-31 DIAGNOSIS — R059 Cough, unspecified: Secondary | ICD-10-CM

## 2018-10-31 HISTORY — DX: Cough, unspecified: R05.9

## 2018-10-31 HISTORY — DX: Cough: R05

## 2018-10-31 MED ORDER — ALBUTEROL SULFATE HFA 108 (90 BASE) MCG/ACT IN AERS: INHALATION_SPRAY | RESPIRATORY_TRACT | 0 refills | Status: DC

## 2018-10-31 MED ORDER — PREDNISONE 20 MG PO TABS: ORAL_TABLET | ORAL | 0 refills | Status: DC

## 2018-10-31 NOTE — Progress Notes (Signed)
Telemedicine Encounter- SOAP NOTE Established Patient  This telephone encounter was conducted with the patient's (or proxy's) verbal consent via audio telecommunications: yes/no: Yes Patient was instructed to have this encounter in a suitably private space; and to only have persons present to whom they give permission to participate. In addition, patient identity was confirmed by use of name plus two identifiers (DOB and address).  I discussed the limitations, risks, security and privacy concerns of performing an evaluation and management service by telephone and the availability of in person appointments. I also discussed with the patient that there may be a patient responsible charge related to this service. The patient expressed understanding and agreed to proceed.  I spent a total of TIME; 0 MIN TO 60 MIN: 15 minutes talking with the patient or their proxy.  No chief complaint on file.   Subjective   Kathleen Douglas is a 68 y.o. established patient. Telephone visit today for cough/negative covid  HPI   Kathleen Douglas reports that her husband recently was positive for Covid.  She tested negative for Co-vid August 19 and September 8.  Cough is dry, worse at night, and affecting sleeping.  She feels some pressure on chest in the past 10 days, not with every cough but present.  Denies SOB, DOE.Recently started taking Mucinex q 12 hours which has improved the symptoms transiently.  She denies any fevers, chills, night sweats.  She has been taking her temperature regularly.  No loss of taste or smell.   Cough is persistent despite OTC treatment.  Prior to Covid, her husband was treated with Albuterol and a steroid which helped him immensely.  She questions if she could do the same.  She is not immunocompromised. Has not been exposed to any allergens.  She has been at home isolating with husband since mid-August.    Patient Active Problem List   Diagnosis Date Noted  . Dyslipidemia 03/03/2016   . Vitamin D deficiency 03/03/2016    Past Medical History:  Diagnosis Date  . Osteopenia   . Vitamin D deficiency     Current Outpatient Medications  Medication Sig Dispense Refill  . Calcium Carbonate-Vitamin D (CALCIUM + D PO) Take 4 tablets by mouth daily. Reported on 06/05/2015    . loratadine (CLARITIN) 10 MG tablet Take 1 tablet (10 mg total) by mouth daily. 30 tablet 11  . multivitamin-iron-minerals-folic acid (CENTRUM) chewable tablet Chew 1 tablet by mouth daily.    . permethrin (ELIMITE) 5 % cream Thoroughly massage cream (30 g for average adult) from head to soles of feet; leave on for 8 to 14 hours before removing (shower or bath) 60 g 0  . triamcinolone cream (KENALOG) 0.1 % Apply 1 application topically 2 (two) times daily. 30 g 0   No current facility-administered medications for this visit.     No Known Allergies  Social History   Socioeconomic History  . Marital status: Married    Spouse name: Not on file  . Number of children: Not on file  . Years of education: Not on file  . Highest education level: Not on file  Occupational History  . Not on file  Social Needs  . Financial resource strain: Not on file  . Food insecurity    Worry: Not on file    Inability: Not on file  . Transportation needs    Medical: Not on file    Non-medical: Not on file  Tobacco Use  . Smoking status: Never Smoker  .  Smokeless tobacco: Never Used  Substance and Sexual Activity  . Alcohol use: Yes    Comment: socially, occ.  . Drug use: No  . Sexual activity: Yes    Birth control/protection: None  Lifestyle  . Physical activity    Days per week: Not on file    Minutes per session: Not on file  . Stress: Not on file  Relationships  . Social Herbalist on phone: Not on file    Gets together: Not on file    Attends religious service: Not on file    Active member of club or organization: Not on file    Attends meetings of clubs or organizations: Not on file     Relationship status: Not on file  . Intimate partner violence    Fear of current or ex partner: Not on file    Emotionally abused: Not on file    Physically abused: Not on file    Forced sexual activity: Not on file  Other Topics Concern  . Not on file  Social History Narrative  . Not on file    ROS  Review of Systems  Constitutional: Negative for activity change, appetite change, chills and fever.  HENT: Negative for congestion, nosebleeds, trouble swallowing, + for voice change.  Respiratory: Positive for cough, chest wall pain with coughing, negative for SOB, DOE Gastrointestinal: Negative for diarrhea, nausea and vomiting.   See HPI. All other review of systems negative.                           Objective   Physical Exam General Alert and Oriented, no distress Lungs:no audible wheezing or crackles Neuro:Intact.  Psych: Speech normal, non-pressured.  Linear though process   Vitals as reported by the patient: Today's Vitals   10/31/18 1353  Pulse: 81  Temp: 98.1 F (36.7 C)  SpO2: 98%  Weight: 150 lb (68 kg)  Height: 5\' 3"  (1.6 m)    Kathleen Douglas was seen today for cough.  Diagnoses and all orders for this visit:  Cough  Other orders -     albuterol (VENTOLIN HFA) 108 (90 Base) MCG/ACT inhaler; 1-2 puffs q 4-6 hours prn for cough. -     predniSONE (DELTASONE) 20 MG tablet; 2 tabs x 3 days, 1 tab x 3 days  PLAN:  Patient presents with a dry cough interfering with sleep at night in the context of 2x negative Covids.  We discussed an Albuterol inhaler and how to use.  Would prefer she use that for 48 hours and if improved, hold Prednisone.  If no better, she may take Prednisone taper.  Reviewed side effects and risks/benefits with the patient.  She verbalized understanding and wishes to proceed.  She will call or return if no improvement in 7-10 days.  All questions answered.     I discussed the assessment and treatment plan with the patient. The patient was provided  an opportunity to ask questions and all were answered. The patient agreed with the plan and demonstrated an understanding of the instructions.   The patient was advised to call back or seek an in-person evaluation if the symptoms worsen or if the condition fails to improve as anticipated.  I provided 15 minutes of non-face-to-face time during this encounter.  Glyn Ade, NP  [facility]

## 2018-10-31 NOTE — Progress Notes (Signed)
Cc:  Tested 08/19 and 09/08.  Both were negative. Since most recent neg test has had productive cough, chest congestion.  Taking Mucinex and Delsum.  Is just miserable.  Afebrile and thinks her pulse ox is 98.

## 2018-11-14 ENCOUNTER — Other Ambulatory Visit: Payer: Self-pay

## 2018-11-14 ENCOUNTER — Ambulatory Visit
Admission: RE | Admit: 2018-11-14 | Discharge: 2018-11-14 | Disposition: A | Discharge status: Home / Self Care | Payer: Medicare Other | Source: Ambulatory Visit | Attending: Family Medicine | Admitting: Family Medicine

## 2018-11-14 DIAGNOSIS — Z1231 Encounter for screening mammogram for malignant neoplasm of breast: Secondary | ICD-10-CM

## 2019-03-07 DIAGNOSIS — Z23 Encounter for immunization: Secondary | ICD-10-CM | POA: Diagnosis not present

## 2019-03-28 DIAGNOSIS — Z23 Encounter for immunization: Secondary | ICD-10-CM | POA: Diagnosis not present

## 2019-05-16 ENCOUNTER — Other Ambulatory Visit: Payer: Self-pay | Admitting: *Deleted

## 2019-05-16 DIAGNOSIS — M8588 Other specified disorders of bone density and structure, other site: Secondary | ICD-10-CM

## 2019-05-16 DIAGNOSIS — E559 Vitamin D deficiency, unspecified: Secondary | ICD-10-CM

## 2019-05-21 ENCOUNTER — Ambulatory Visit (INDEPENDENT_AMBULATORY_CARE_PROVIDER_SITE_OTHER): Payer: Medicare Other | Admitting: Family Medicine

## 2019-05-21 VITALS — BP 130/87 | Ht 63.0 in | Wt 150.0 lb

## 2019-05-21 DIAGNOSIS — E559 Vitamin D deficiency, unspecified: Secondary | ICD-10-CM | POA: Diagnosis not present

## 2019-05-21 DIAGNOSIS — M8588 Other specified disorders of bone density and structure, other site: Secondary | ICD-10-CM

## 2019-05-21 NOTE — Patient Instructions (Addendum)
Thank you for taking time to come for your Medicare Wellness Visit. I appreciate your ongoing commitment to your health goals. Please review the following plan we discussed and let me know if I can assist you in the future.  Jahred Tatar LPN  Preventive Care 70 Years and Older, Female Preventive care refers to lifestyle choices and visits with your health care provider that can promote health and wellness. This includes:  A yearly physical exam. This is also called an annual well check.  Regular dental and eye exams.  Immunizations.  Screening for certain conditions.  Healthy lifestyle choices, such as diet and exercise. What can I expect for my preventive care visit? Physical exam Your health care provider will check:  Height and weight. These may be used to calculate body mass index (BMI), which is a measurement that tells if you are at a healthy weight.  Heart rate and blood pressure.  Your skin for abnormal spots. Counseling Your health care provider may ask you questions about:  Alcohol, tobacco, and drug use.  Emotional well-being.  Home and relationship well-being.  Sexual activity.  Eating habits.  History of falls.  Memory and ability to understand (cognition).  Work and work environment.  Pregnancy and menstrual history. What immunizations do I need?  Influenza (flu) vaccine  This is recommended every year. Tetanus, diphtheria, and pertussis (Tdap) vaccine  You may need a Td booster every 10 years. Varicella (chickenpox) vaccine  You may need this vaccine if you have not already been vaccinated. Zoster (shingles) vaccine  You may need this after age 60. Pneumococcal conjugate (PCV13) vaccine  One dose is recommended after age 70. Pneumococcal polysaccharide (PPSV23) vaccine  One dose is recommended after age 70. Measles, mumps, and rubella (MMR) vaccine  You may need at least one dose of MMR if you were born in 1957 or later. You may also  need a second dose. Meningococcal conjugate (MenACWY) vaccine  You may need this if you have certain conditions. Hepatitis A vaccine  You may need this if you have certain conditions or if you travel or work in places where you may be exposed to hepatitis A. Hepatitis B vaccine  You may need this if you have certain conditions or if you travel or work in places where you may be exposed to hepatitis B. Haemophilus influenzae type b (Hib) vaccine  You may need this if you have certain conditions. You may receive vaccines as individual doses or as more than one vaccine together in one shot (combination vaccines). Talk with your health care provider about the risks and benefits of combination vaccines. What tests do I need? Blood tests  Lipid and cholesterol levels. These may be checked every 5 years, or more frequently depending on your overall health.  Hepatitis C test.  Hepatitis B test. Screening  Lung cancer screening. You may have this screening every year starting at age 55 if you have a 30-pack-year history of smoking and currently smoke or have quit within the past 15 years.  Colorectal cancer screening. All adults should have this screening starting at age 50 and continuing until age 75. Your health care provider may recommend screening at age 45 if you are at increased risk. You will have tests every 1-10 years, depending on your results and the type of screening test.  Diabetes screening. This is done by checking your blood sugar (glucose) after you have not eaten for a while (fasting). You may have this done every 1-3   years.  Mammogram. This may be done every 1-2 years. Talk with your health care provider about how often you should have regular mammograms.  BRCA-related cancer screening. This may be done if you have a family history of breast, ovarian, tubal, or peritoneal cancers. Other tests  Sexually transmitted disease (STD) testing.  Bone density scan. This is done  to screen for osteoporosis. You may have this done starting at age 70. Follow these instructions at home: Eating and drinking  Eat a diet that includes fresh fruits and vegetables, whole grains, lean protein, and low-fat dairy products. Limit your intake of foods with high amounts of sugar, saturated fats, and salt.  Take vitamin and mineral supplements as recommended by your health care provider.  Do not drink alcohol if your health care provider tells you not to drink.  If you drink alcohol: ? Limit how much you have to 0-1 drink a day. ? Be aware of how much alcohol is in your drink. In the [place] one drink equals one 12 oz bottle of beer (355 mL), one 5 oz glass of wine (148 mL), or one 1 oz glass of hard liquor (44 mL). Lifestyle  Take daily care of your teeth and gums.  Stay active. Exercise for at least 30 minutes on 5 or more days each week.  Do not use any products that contain nicotine or tobacco, such as cigarettes, e-cigarettes, and chewing tobacco. If you need help quitting, ask your health care provider.  If you are sexually active, practice safe sex. Use a condom or other form of protection in order to prevent STIs (sexually transmitted infections).  Talk with your health care provider about taking a low-dose aspirin or statin. What's next?  Go to your health care provider once a year for a well check visit.  Ask your health care provider how often you should have your eyes and teeth checked.  Stay up to date on all vaccines. This information is not intended to replace advice given to you by your health care provider. Make sure you discuss any questions you have with your health care provider. Document Revised: 01/19/2018 Document Reviewed: 01/19/2018 Elsevier Patient Education  2020 [employer].

## 2019-05-21 NOTE — Progress Notes (Signed)
Presents today for [employer] Visit   Date of last exam: 10/31/2018  Interpreter used for this visit? No  I connected with  Kathleen Douglas on 05/21/19 by telephone  and verified that I am speaking with the correct person using two identifiers.   I discussed the limitations of evaluation and management by telemedicine. The patient expressed understanding and agreed to proceed.   Patient Care Team: Forrest Moron, MD as PCP - General (Internal Medicine)   Other items to address today:   Discussed Eye Dental Discussed Immunizations New referral put in for [facility] for Bone Density   Other Screening: Last screening for diabetes:08/01/2018 Last lipid screening:08-01-2018   ADVANCE DIRECTIVES: Discussed: yes On File: no Materials Provided: yes  Immunization status:  Immunization History  Administered Date(s) Administered  . Influenza Split 02/21/2012  . Influenza, High Dose Seasonal PF 11/30/2017  . Influenza,inj,Quad PF,6+ Mos 12/30/2014, 03/03/2016  . Pneumococcal Conjugate-13 02/18/2016  . Pneumococcal Polysaccharide-23 02/21/2012  . Tdap 09/22/2013     Health Maintenance Due  Topic Date Due  . PNA vac Low Risk Adult (2 of 2 - PPSV23) 02/20/2017     Functional Status Survey: Is the patient deaf or have difficulty hearing?: No Does the patient have difficulty seeing, even when wearing glasses/contacts?: No Does the patient have difficulty concentrating, remembering, or making decisions?: No Does the patient have difficulty walking or climbing stairs?: No Does the patient have difficulty dressing or bathing?: No Does the patient have difficulty doing errands alone such as visiting a doctor's office or shopping?: No   6CIT Screen 05/21/2019  What Year? 0 points  What month? 0 points  Count back from 20 0 points  Months in reverse 0 points  Repeat phrase 0 points        Clinical Support from 05/21/2019 in [place] [facility]    AUDIT-C Score  7       Home Environment:   Lives in a three story home with husband No trouble climbing stairs No scattered rugs No grab bars Adequate lighting / no clutter    Patient Active Problem List   Diagnosis Date Noted  . Cough 10/31/2018  . Dyslipidemia 03/03/2016  . Vitamin D deficiency 03/03/2016     Past Medical History:  Diagnosis Date  . Cough 10/31/2018  . Osteopenia   . Vitamin D deficiency      Past Surgical History:  Procedure Laterality Date  . ABDOMINAL HYSTERECTOMY    . BREAST BIOPSY Left    Excisional   . COLONOSCOPY  2004   Dr.Martin Wynetta Emery  . TUBAL LIGATION       Family History  Problem Relation Age of Onset  . Lymphoma Mother   . Cancer Father   . Hypertension Sister   . Hypertension Daughter   . Cancer Maternal Grandmother   . Diabetes Maternal Grandfather   . Cancer Paternal Grandmother   . Colon cancer Neg Hx   . Esophageal cancer Neg Hx   . Rectal cancer Neg Hx   . Stomach cancer Neg Hx   . Colon polyps Neg Hx      Social History   Socioeconomic History  . Marital status: Married    Spouse name: Not on file  . Number of children: Not on file  . Years of education: Not on file  . Highest education level: Not on file  Occupational History  . Not on file  Tobacco Use  . Smoking  status: Never Smoker  . Smokeless tobacco: Never Used  Substance and Sexual Activity  . Alcohol use: Yes    Comment: socially, occ.  . Drug use: No  . Sexual activity: Yes    Birth control/protection: None  Other Topics Concern  . Not on file  Social History Narrative  . Not on file   Social Determinants of Health   Financial Resource Strain:   . Difficulty of Paying Living Expenses:   Food Insecurity:   . Worried About Charity fundraiser in the Last Year:   . Arboriculturist in the Last Year:   Transportation Needs:   . Film/video editor (Medical):   Marland Kitchen Lack of Transportation (Non-Medical):   Physical Activity:   .  Days of Exercise per Week:   . Minutes of Exercise per Session:   Stress:   . Feeling of Stress :   Social Connections:   . Frequency of Communication with Friends and Family:   . Frequency of Social Gatherings with Friends and Family:   . Attends Religious Services:   . Active Member of Clubs or Organizations:   . Attends Archivist Meetings:   Marland Kitchen Marital Status:   Intimate Partner Violence:   . Fear of Current or Ex-Partner:   . Emotionally Abused:   Marland Kitchen Physically Abused:   . Sexually Abused:      No Known Allergies   Prior to Admission medications   Medication Sig Start Date End Date Taking? Authorizing Provider  albuterol (VENTOLIN HFA) 108 (90 Base) MCG/ACT inhaler 1-2 puffs q 4-6 hours prn for cough. 10/31/18  Yes Wendall Mola, NP  Calcium Carbonate-Vitamin D (CALCIUM + D PO) Take 4 tablets by mouth daily. Reported on 06/05/2015   Yes [provider]  loratadine (CLARITIN) 10 MG tablet Take 1 tablet (10 mg total) by mouth daily. Patient not taking: Reported on 05/21/2019 06/05/18   Forrest Moron, MD  multivitamin-iron-minerals-folic acid (CENTRUM) chewable tablet Chew 1 tablet by mouth daily.    [provider]  permethrin (ELIMITE) 5 % cream Thoroughly massage cream (30 g for average adult) from head to soles of feet; leave on for 8 to 14 hours before removing (shower or bath) Patient not taking: Reported on 05/21/2019 06/05/18   Forrest Moron, MD  predniSONE (DELTASONE) 20 MG tablet 2 tabs x 3 days, 1 tab x 3 days 10/31/18   Wendall Mola, NP  triamcinolone cream (KENALOG) 0.1 % Apply 1 application topically 2 (two) times daily. Patient not taking: Reported on 05/21/2019 06/05/18   Forrest Moron, MD     Depression screen [facility] 2/9 05/21/2019 10/31/2018 06/02/2018 06/02/2018 11/30/2017  Decreased Interest 0 0 0 0 0  Down, Depressed, Hopeless 0 0 0 0 0  PHQ - 2 Score 0 0 0 0 0     Fall Risk  05/21/2019 10/31/2018 06/02/2018 06/02/2018  11/30/2017  Falls in the past year? 0 0 0 0 No  Number falls in past yr: 0 0 - - -  Injury with Fall? 0 0 - - -  Follow up Falls evaluation completed;Education provided Falls evaluation completed - - -      PHYSICAL EXAM: BP 130/87 Comment: taken from previous visit  Ht 5\' 3"  (1.6 m)   Wt 150 lb (68 kg)   BMI 26.57 kg/m    Wt Readings from Last 3 Encounters:  05/21/19 150 lb (68 kg)  10/31/18 150 lb (68 kg)  06/02/18 150 lb (68 kg)       Education/Counseling provided regarding diet and exercise, prevention of chronic diseases, smoking/tobacco cessation, if applicable, and reviewed "Covered Medicare Preventive Services."   ASSESSMENT/PLAN: 1. Vitamin D deficiency  2. Osteopenia of lumbar spine - DG Bone Density

## 2019-06-20 ENCOUNTER — Ambulatory Visit: Payer: Self-pay | Admitting: *Deleted

## 2019-06-20 ENCOUNTER — Emergency Department (HOSPITAL_COMMUNITY)
Admission: EM | Admit: 2019-06-20 | Discharge: 2019-06-20 | Disposition: A | Discharge status: Home / Self Care | Payer: Medicare Other | Attending: Emergency Medicine | Admitting: Emergency Medicine

## 2019-06-20 DIAGNOSIS — M545 Low back pain: Secondary | ICD-10-CM | POA: Diagnosis not present

## 2019-06-20 DIAGNOSIS — I10 Essential (primary) hypertension: Secondary | ICD-10-CM | POA: Diagnosis not present

## 2019-06-20 DIAGNOSIS — Z041 Encounter for examination and observation following transport accident: Secondary | ICD-10-CM | POA: Diagnosis not present

## 2019-06-20 DIAGNOSIS — Z79899 Other long term (current) drug therapy: Secondary | ICD-10-CM | POA: Diagnosis not present

## 2019-06-20 DIAGNOSIS — M549 Dorsalgia, unspecified: Secondary | ICD-10-CM | POA: Insufficient documentation

## 2019-06-20 DIAGNOSIS — R0902 Hypoxemia: Secondary | ICD-10-CM | POA: Diagnosis not present
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
## 2019-06-21 ENCOUNTER — Telehealth: Payer: Self-pay

## 2019-06-21 NOTE — Telephone Encounter (Signed)
Called pt, na. Pt called to speak with Delores Gaddy. Sent message to call pt back.

## 2019-06-21 NOTE — Telephone Encounter (Signed)
FYI...Please Advise  °

## 2020-01-06 ENCOUNTER — Ambulatory Visit (HOSPITAL_COMMUNITY)
Admission: EM | Admit: 2020-01-06 | Discharge: 2020-01-06 | Disposition: A | Discharge status: Home / Self Care | Payer: Medicare Other | Attending: Family Medicine | Admitting: Family Medicine

## 2020-01-06 ENCOUNTER — Other Ambulatory Visit: Payer: Self-pay

## 2020-01-06 ENCOUNTER — Encounter (HOSPITAL_COMMUNITY): Payer: Self-pay | Admitting: Family Medicine

## 2020-01-06 DIAGNOSIS — Z20822 Contact with and (suspected) exposure to covid-19: Secondary | ICD-10-CM | POA: Insufficient documentation

## 2020-01-06 DIAGNOSIS — J069 Acute upper respiratory infection, unspecified: Secondary | ICD-10-CM | POA: Insufficient documentation

## 2020-01-06 LAB — RESP PANEL BY RT-PCR (FLU A&B, COVID) ARPGX2
Influenza A by PCR: NEGATIVE
Influenza B by PCR: NEGATIVE
SARS Coronavirus 2 by RT PCR: NEGATIVE

## 2020-01-06 MED ORDER — HYDROCODONE-HOMATROPINE 5-1.5 MG/5ML PO SYRP
5.0000 mL | ORAL_SOLUTION | Freq: Four times a day (QID) | ORAL | 0 refills | Status: DC | PRN
Start: 2020-01-06 — End: 2020-02-27

## 2020-01-06 NOTE — ED Provider Notes (Signed)
[name]    CSN: [id number] Arrival date & time: 01/06/20  1138      History   Chief Complaint Chief Complaint  Patient presents with  . Cough  . Otalgia    LT   HPI Kathleen Douglas is a 69 y.o. female.   This is the initial Kathleen Douglas urgent care visit for this 69 year old woman who is complaining of cough, neck, and back pain.  Patient developed sore throat and cough 4 days ago.  She has not had a fever.  The cough is productive and there is some pink discoloration.  She is not short of breath.  She has had no history of asthma but has used an inhaler in the past when she has had a bad cough.  She has no known contacts with Covid.  She works at [employer].     Past Medical History:  Diagnosis Date  . Cough 10/31/2018  . Osteopenia   . Vitamin D deficiency     Patient Active Problem List   Diagnosis Date Noted  . Cough 10/31/2018  . Dyslipidemia 03/03/2016  . Vitamin D deficiency 03/03/2016    Past Surgical History:  Procedure Laterality Date  . ABDOMINAL HYSTERECTOMY    . BREAST BIOPSY Left    Excisional   . COLONOSCOPY  2004   Dr.Martin Wynetta Emery  . TUBAL LIGATION      OB History   No obstetric history on file.      Home Medications    Prior to Admission medications   Medication Sig Start Date End Date Taking? Authorizing Provider  albuterol (VENTOLIN HFA) 108 (90 Base) MCG/ACT inhaler 1-2 puffs q 4-6 hours prn for cough. 10/31/18  Yes Wendall Mola, NP  Calcium Carbonate-Vitamin D (CALCIUM + D PO) Take 2 tablets by mouth every evening. Reported on 06/05/2015   Yes [provider]  HYDROcodone-homatropine (HYDROMET) 5-1.5 MG/5ML syrup Take 5 mLs by mouth every 6 (six) hours as needed for cough. 01/06/20   Robyn Haber, MD  loratadine (CLARITIN) 10 MG tablet Take 1 tablet (10 mg total) by mouth daily. Patient not taking: Reported on 05/21/2019 06/05/18 01/06/20  Forrest Moron, MD    Family History Family History    Problem Relation Age of Onset  . Lymphoma Mother   . Cancer Father   . Hypertension Sister   . Hypertension Daughter   . Cancer Maternal Grandmother   . Diabetes Maternal Grandfather   . Cancer Paternal Grandmother   . Colon cancer Neg Hx   . Esophageal cancer Neg Hx   . Rectal cancer Neg Hx   . Stomach cancer Neg Hx   . Colon polyps Neg Hx     Social History Social History   Tobacco Use  . Smoking status: Never Smoker  . Smokeless tobacco: Never Used  Vaping Use  . Vaping Use: Never used  Substance Use Topics  . Alcohol use: Yes    Comment: socially, occ.  . Drug use: No     Allergies   Patient has no known allergies.   Review of Systems Review of Systems  Constitutional: Positive for fatigue. Negative for fever.  HENT: Positive for sore throat.   Respiratory: Positive for cough.   Gastrointestinal: Negative.   Musculoskeletal: Positive for myalgias.     Physical Exam Triage Vital Signs ED Triage Vitals  Enc Vitals Group     BP      Pulse      Resp  Temp      Temp src      SpO2      Weight      Height      Head Circumference      Peak Flow      Pain Score      Pain Loc      Pain Edu?      Excl. in [place]?    No data found.  Updated Vital Signs BP (!) 187/83 (BP Location: Right Arm)   Pulse 74   Temp 98 F (36.7 C) (Oral)   Resp 18   Ht 5\' 3"  (1.6 m)   Wt 72.6 kg   SpO2 100%   BMI 28.34 kg/m   Physical Exam Vitals and nursing note reviewed.  Constitutional:      Appearance: Normal appearance.  HENT:     Head: Normocephalic.     Mouth/Throat:     Mouth: Mucous membranes are moist.     Pharynx: Oropharynx is clear. Posterior oropharyngeal erythema present.  Eyes:     Conjunctiva/sclera: Conjunctivae normal.  Cardiovascular:     Rate and Rhythm: Normal rate.     Heart sounds: Normal heart sounds.  Pulmonary:     Effort: Pulmonary effort is normal.     Breath sounds: Rhonchi present.  Musculoskeletal:        General: Normal  range of motion.  Skin:    General: Skin is warm and dry.  Neurological:     General: No focal deficit present.     Mental Status: She is alert.  Psychiatric:        Mood and Affect: Mood normal.        Behavior: Behavior normal.        Thought Content: Thought content normal.      UC Treatments / Results  Labs (all labs ordered are listed, but only abnormal results are displayed) Labs Reviewed  RESP PANEL BY RT-PCR (FLU A&B, COVID) [id number]    EKG   Radiology No results found.  Procedures Procedures (including critical care time)  Medications Ordered in UC Medications - No data to display  Initial Impression / Assessment and Plan / UC Course  I have reviewed the triage vital signs and the nursing notes.  Pertinent labs & imaging results that were available during my care of the patient were reviewed by me and considered in my medical decision making (see chart for details).    Final Clinical Impressions(s) / UC Diagnoses   Final diagnoses:  Viral URI with cough     Discharge Instructions     We are running a Covid test (takes two days to come back) to make sure you are safe.  Sleep in separate room from your husband until test results are back  Use the inhaler three times a day until cough resolves.    ED Prescriptions    Medication Sig Dispense Auth. Provider   HYDROcodone-homatropine (HYDROMET) 5-1.5 MG/5ML syrup Take 5 mLs by mouth every 6 (six) hours as needed for cough. 60 mL Robyn Haber, MD     I have reviewed the PDMP during this encounter.   Robyn Haber, MD 01/06/20 1312
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
## 2020-01-06 NOTE — Discharge Instructions (Signed)
We are running a Covid test (takes two days to come back) to make sure you are safe.  Sleep in separate room from your husband until test results are back  Use the inhaler three times a day until cough resolves.

## 2020-01-21 IMAGING — MG MM DIGITAL SCREENING BILAT W/ TOMO W/ CAD
8 series · 8 of 24 positions shown · non-contrast
Comparison: Previous exam(s).

CLINICAL DATA: Screening.

EXAM:
DIGITAL SCREENING BILATERAL MAMMOGRAM WITH TOMO AND CAD

[L CC synth-2D]
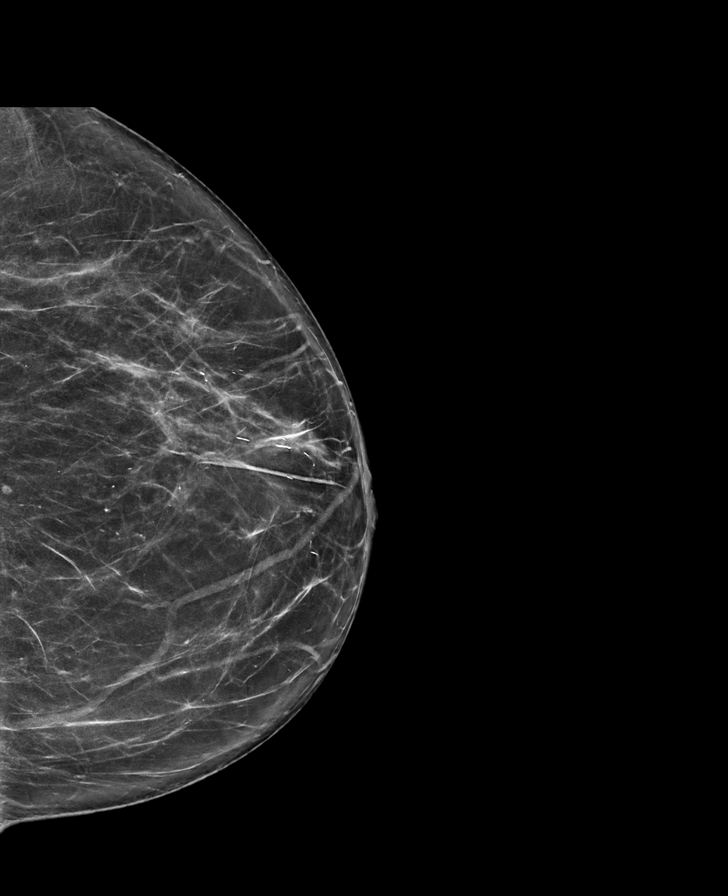

[R MLO synth-2D]
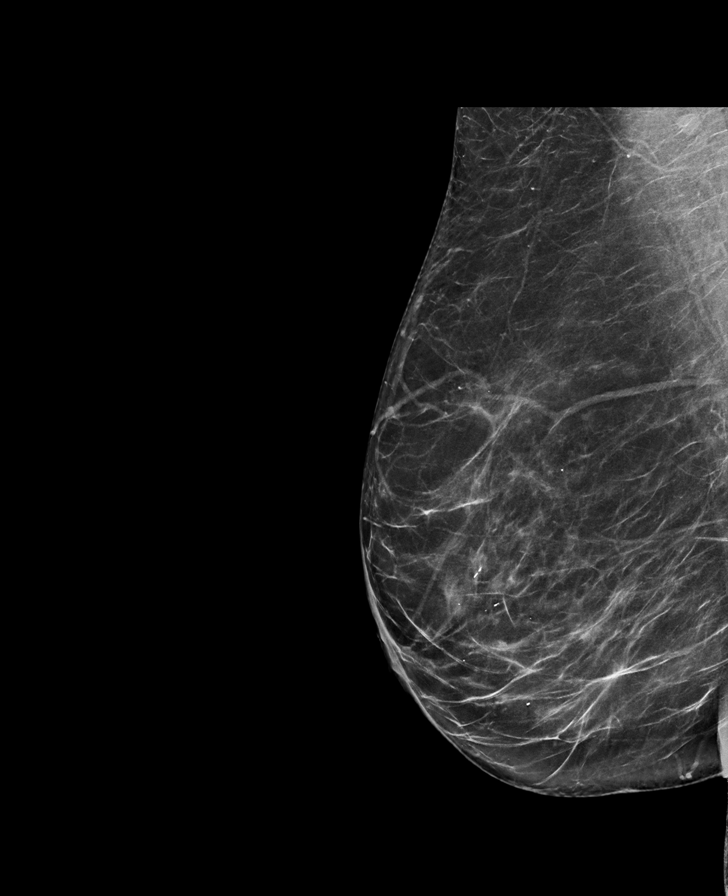

[L MLO synth-2D]
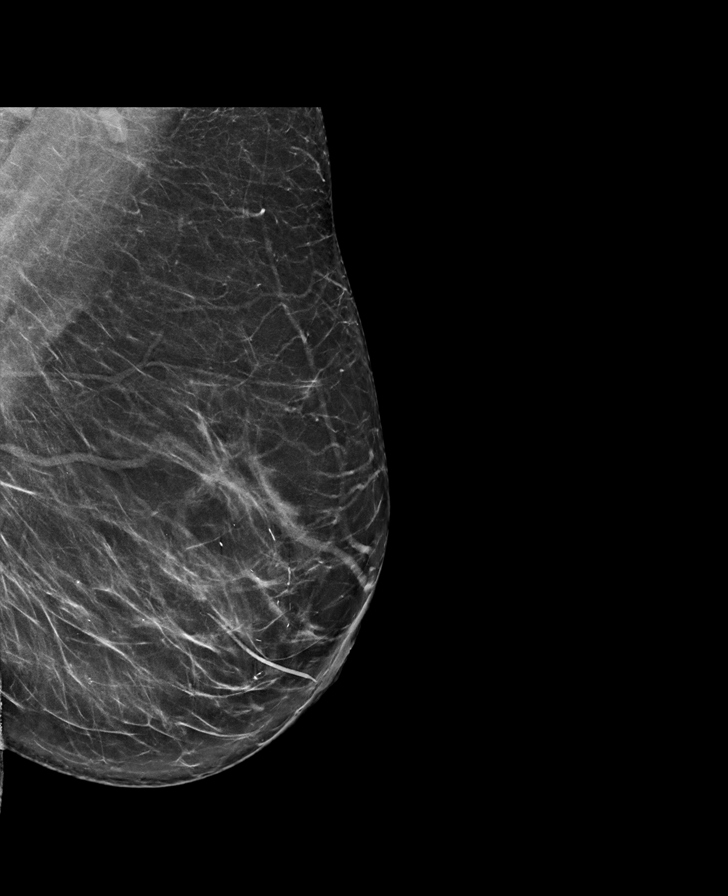

[R CC synth-2D]
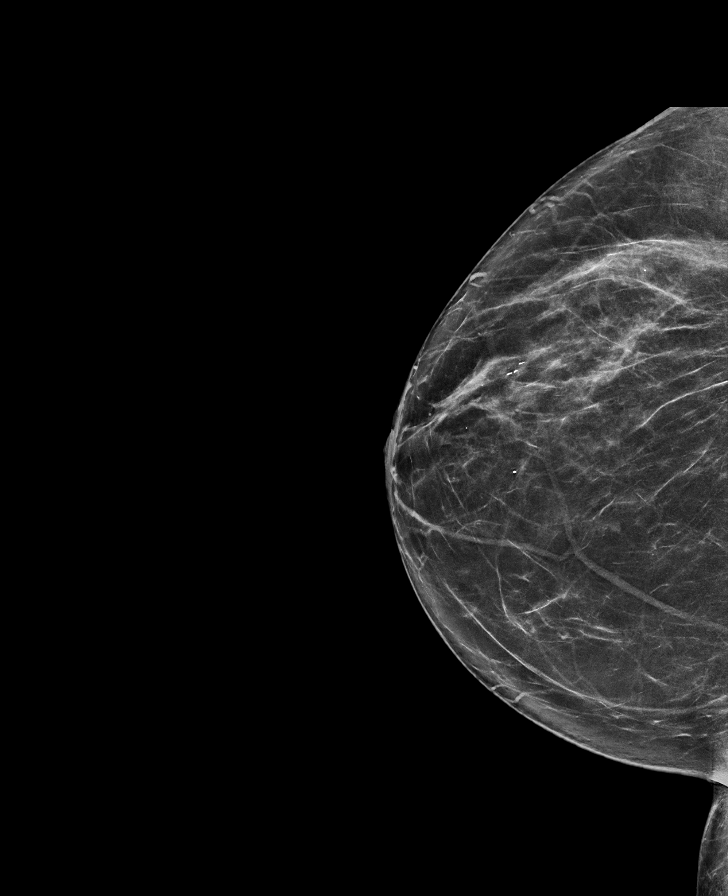

[R CC tomo · tomo slice 37/74.0]
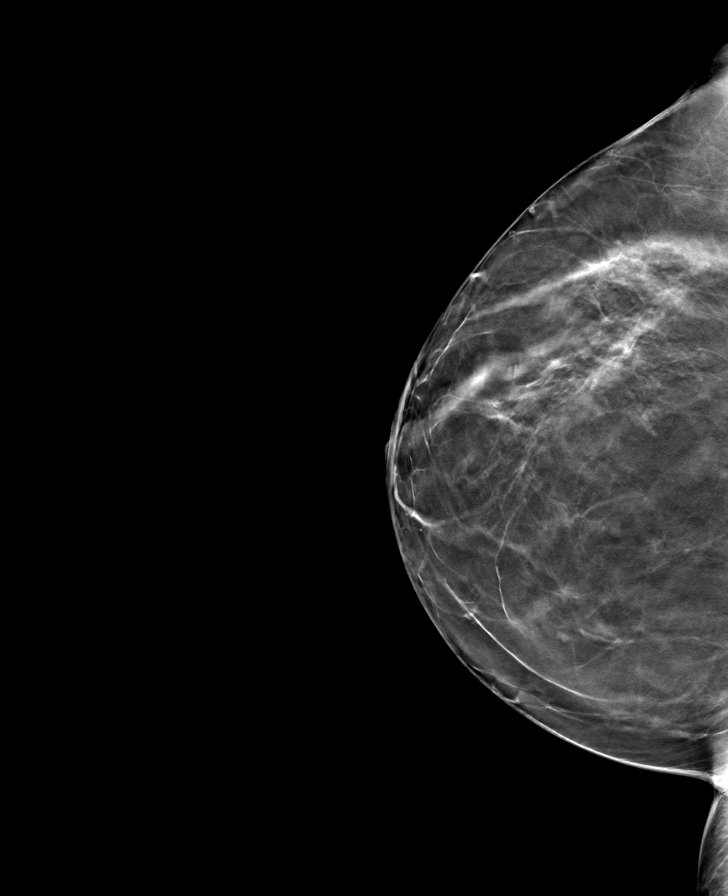

[R MLO tomo · tomo slice 39/76.0]
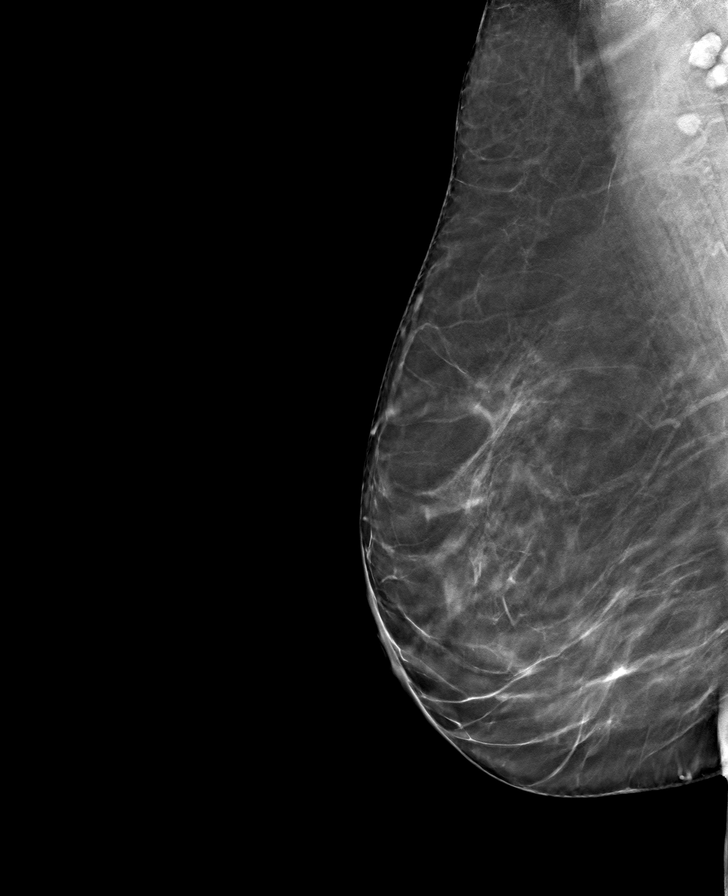

[L CC tomo · tomo slice 37/74.0]
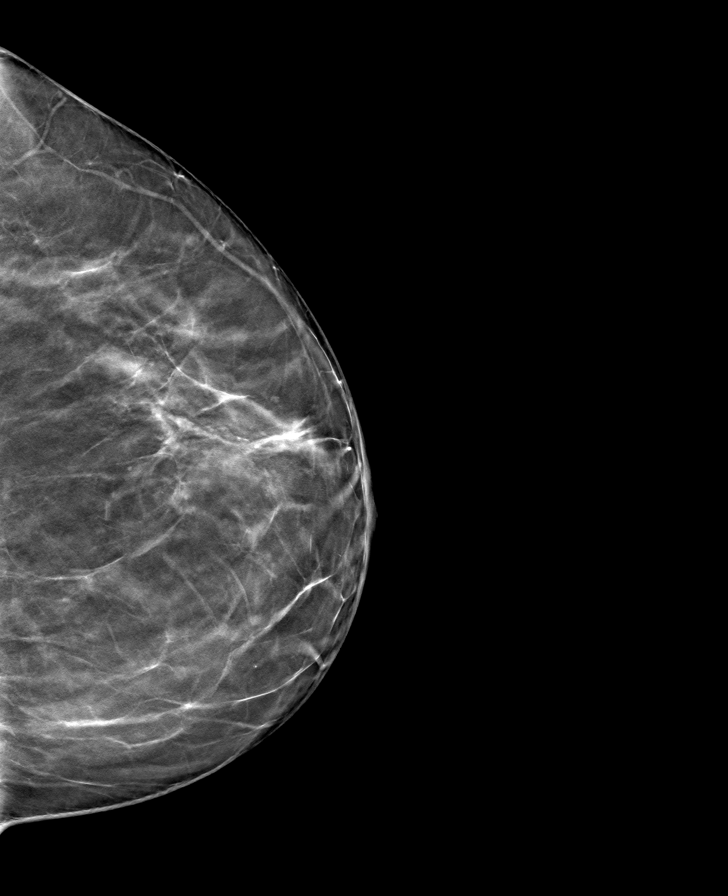

[L MLO tomo · tomo slice 41/82.0]
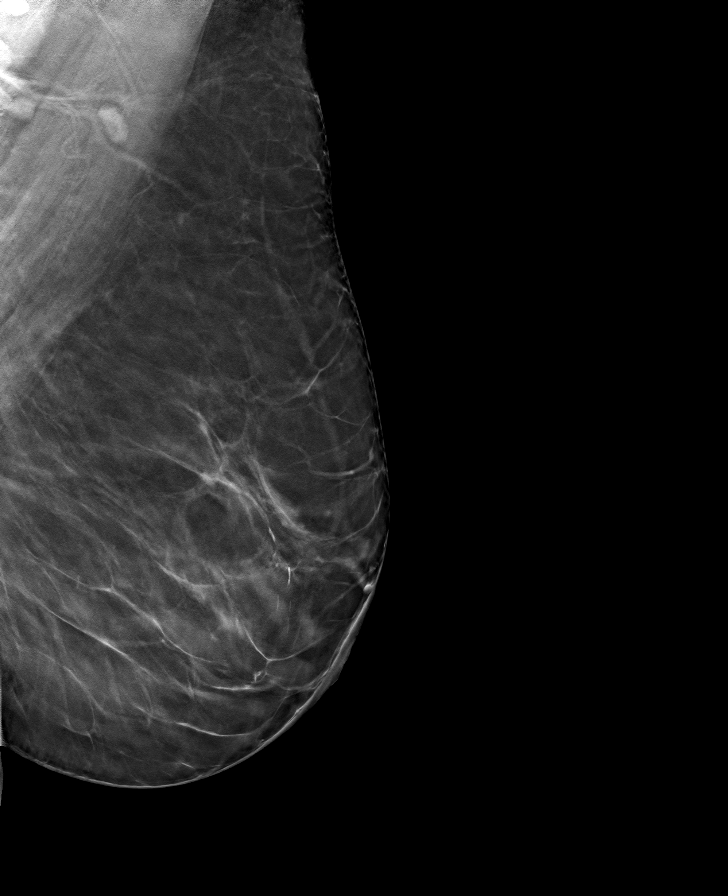

[8 of 24 positions shown; findings below may reference images not displayed]

ACR Breast Density Category b: There are scattered areas of
fibroglandular density.
FINDINGS: There are no findings suspicious for malignancy. Images were
processed with CAD.
IMPRESSION: No mammographic evidence of malignancy. A result letter of this
screening mammogram will be mailed directly to the patient.

RECOMMENDATION:
Screening mammogram in one year. (Code:[id number])

BI-RADS CATEGORY  1: Negative.

## 2020-02-26 ENCOUNTER — Other Ambulatory Visit: Payer: Self-pay | Admitting: Family Medicine

## 2020-02-26 DIAGNOSIS — Z1231 Encounter for screening mammogram for malignant neoplasm of breast: Secondary | ICD-10-CM

## 2020-02-27 ENCOUNTER — Ambulatory Visit
Admission: RE | Admit: 2020-02-27 | Discharge: 2020-02-27 | Disposition: A | Discharge status: Home / Self Care | Payer: Medicare Other | Source: Ambulatory Visit | Attending: Family Medicine | Admitting: Family Medicine

## 2020-02-27 ENCOUNTER — Other Ambulatory Visit: Payer: Self-pay

## 2020-02-27 ENCOUNTER — Telehealth (INDEPENDENT_AMBULATORY_CARE_PROVIDER_SITE_OTHER): Payer: Medicare Other | Admitting: Family Medicine

## 2020-02-27 ENCOUNTER — Encounter: Payer: Self-pay | Admitting: Family Medicine

## 2020-02-27 DIAGNOSIS — R059 Cough, unspecified: Secondary | ICD-10-CM

## 2020-02-27 DIAGNOSIS — Z1231 Encounter for screening mammogram for malignant neoplasm of breast: Secondary | ICD-10-CM

## 2020-02-27 MED ORDER — CETIRIZINE HCL 10 MG PO TABS: 10.0000 mg | ORAL_TABLET | Freq: Every day | ORAL | 11 refills | Status: DC

## 2020-02-27 MED ORDER — ALBUTEROL SULFATE HFA 108 (90 BASE) MCG/ACT IN AERS: INHALATION_SPRAY | RESPIRATORY_TRACT | 4 refills | Status: DC

## 2020-02-27 MED ORDER — PREDNISONE 20 MG PO TABS: ORAL_TABLET | ORAL | 0 refills | Status: AC

## 2020-02-27 NOTE — Progress Notes (Signed)
Virtual Visit Note  I connected with patient on 02/27/20 at 1145 by telephone due to unable to work Epic video visit and verified that I am speaking with the correct person using two identifiers. Kathleen Douglas is currently located at home and no family members are currently with them during visit. The provider, Laurita Quint Cecilio Ohlrich, FNP is located in their office at time of visit.  I discussed the limitations, risks, security and privacy concerns of performing an evaluation and management service by telephone and the availability of in person appointments. I also discussed with the patient that there may be a patient responsible charge related to this service. The patient expressed understanding and agreed to proceed.   I provided 20 minutes of non-face-to-face time during this encounter.  Chief Complaint  Patient presents with  . Cough    Exacerbations morning and nights , clear phlegm - otc amthistamine - has had negative covid tests and wants to clear any possible infections prior to scheduling for covid booster shot    HPI ? Has been home testing and has been negative Works in Neurosurgeon Has a mammogram scheduled for today Will get COVID booster after mammogram Denies history of smoking Symptoms haven't improved since November Urgent care visit Was given cough syrup at that appointment but did not feel comfortable taking it Prefers to take as few medications as possible Was given the albuterol in the past but has not used in years Has this inhaler at home but most likely expired    No Known Allergies  Prior to Admission medications   Medication Sig Start Date End Date Taking? Authorizing Provider  Calcium Carbonate-Vitamin D (CALCIUM + D PO) Take 2 tablets by mouth every evening. Reported on 06/05/2015   Yes [provider]  Multiple Vitamin (MULTIVITAMIN) tablet Take 1 tablet by mouth daily.   Yes [provider]  albuterol (VENTOLIN HFA) 108 (90  Base) MCG/ACT inhaler 1-2 puffs q 4-6 hours prn for cough. Patient not taking: Reported on 02/27/2020 10/31/18   Wendall Mola, NP  HYDROcodone-homatropine (HYDROMET) 5-1.5 MG/5ML syrup Take 5 mLs by mouth every 6 (six) hours as needed for cough. Patient not taking: Reported on 02/27/2020 01/06/20   Robyn Haber, MD  loratadine (CLARITIN) 10 MG tablet Take 1 tablet (10 mg total) by mouth daily. Patient not taking: No sig reported 06/05/18 01/06/20  Forrest Moron, MD    Past Medical History:  Diagnosis Date  . Cough 10/31/2018  . Osteopenia   . Vitamin D deficiency     Past Surgical History:  Procedure Laterality Date  . ABDOMINAL HYSTERECTOMY    . BREAST BIOPSY Left    Excisional   . COLONOSCOPY  2004   Dr.Martin Wynetta Emery  . TUBAL LIGATION      Social History   Tobacco Use  . Smoking status: Never Smoker  . Smokeless tobacco: Never Used  Substance Use Topics  . Alcohol use: Yes    Comment: socially, occ.    Family History  Problem Relation Age of Onset  . Lymphoma Mother   . Cancer Father   . Hypertension Sister   . Hypertension Daughter   . Cancer Maternal Grandmother   . Diabetes Maternal Grandfather   . Cancer Paternal Grandmother   . Colon cancer Neg Hx   . Esophageal cancer Neg Hx   . Rectal cancer Neg Hx   . Stomach cancer Neg Hx   . Colon polyps Neg Hx  Review of Systems  Constitutional: Negative for chills, fever and malaise/fatigue.  HENT: Negative for congestion, sinus pain and sore throat.   Eyes: Negative for blurred vision and double vision.  Respiratory: Positive for cough. Negative for sputum production, shortness of breath and wheezing.   Cardiovascular: Negative for chest pain, palpitations and leg swelling.  Gastrointestinal: Negative for abdominal pain, blood in stool, constipation, diarrhea, heartburn, nausea and vomiting.  Genitourinary: Negative for dysuria, frequency and hematuria.  Musculoskeletal: Negative for back pain and  joint pain.  Skin: Negative for rash.  Neurological: Negative for dizziness, weakness and headaches.    Objective  Constitutional:      General: Not in acute distress.    Appearance: Normal appearance. Not ill-appearing.   Pulmonary:     Effort: Pulmonary effort is normal. No respiratory distress.  Neurological:     Mental Status: Alert and oriented to person, place, and time.  Psychiatric:        Mood and Affect: Mood normal.        Behavior: Behavior normal.     ASSESSMENT and PLAN  Problem List Items Addressed This Visit      Other   Cough - Primary (Chronic)   Relevant Medications   albuterol (VENTOLIN HFA) 108 (90 Base) MCG/ACT inhaler   cetirizine (ZYRTEC) 10 MG tablet   predniSONE (DELTASONE) 20 MG tablet      Plan . Albuterol as needed . Prednisone taper . Zyrtec qhs . RTC/ED precautions . R/se/b of medications discussed   Return in about 4 weeks (around 03/26/2020).   The above assessment and management plan was discussed with the patient. The patient verbalized understanding of and has agreed to the management plan. Patient is aware to call the clinic if symptoms persist or worsen. Patient is aware when to return to the clinic for a follow-up visit. Patient educated on when it is appropriate to go to the emergency department.    Huston Foley Summers Buendia, FNP-BC Primary Care at [facility] [place], [place] [address] Ph.  [phone] Fax [phone]

## 2020-02-27 NOTE — Patient Instructions (Addendum)
 Cough, Adult A cough helps to clear your throat and lungs. A cough may be a sign of an illness or another medical condition. An acute cough may only last 2-3 weeks, while a chronic cough may last 8 or more weeks. Many things can cause a cough. They include:  Germs (viruses or bacteria) that attack the airway.  Breathing in things that bother (irritate) your lungs.  Allergies.  Asthma.  Mucus that runs down the back of your throat (postnasal drip).  Smoking.  Acid backing up from the stomach into the tube that moves food from the mouth to the stomach (gastroesophageal reflux).  Some medicines.  Lung problems.  Other medical conditions, such as heart failure or a blood clot in the lung (pulmonary embolism). Follow these instructions at home: Medicines  Take over-the-counter and prescription medicines only as told by your doctor.  Talk with your doctor before you take medicines that stop a cough (cough suppressants). Lifestyle  Do not smoke, and try not to be around smoke. Do not use any products that contain nicotine or tobacco, such as cigarettes, e-cigarettes, and chewing tobacco. If you need help quitting, ask your doctor.  Drink enough fluid to keep your pee (urine) pale yellow.  Avoid caffeine.  Do not drink alcohol if your doctor tells you not to drink.   General instructions  Watch for any changes in your cough. Tell your doctor about them.  Always cover your mouth when you cough.  Stay away from things that make you cough, such as perfume, candles, campfire smoke, or cleaning products.  If the air is dry, use a cool mist vaporizer or humidifier in your home.  If your cough is worse at night, try using extra pillows to raise your head up higher while you sleep.  Rest as needed.  Keep all follow-up visits as told by your doctor. This is important.   Contact a doctor if:  You have new symptoms.  You cough up pus.  Your cough does not get better after  2-3 weeks, or your cough gets worse.  Cough medicine does not help your cough and you are not sleeping well.  You have pain that gets worse or pain that is not helped with medicine.  You have a fever.  You are losing weight and you do not know why.  You have night sweats. Get help right away if:  You cough up blood.  You have trouble breathing.  Your heartbeat is very fast. These symptoms may be an emergency. Do not wait to see if the symptoms will go away. Get medical help right away. Call your local emergency services (911 in the [place] Do not drive yourself to the hospital. Summary  A cough helps to clear your throat and lungs. Many things can cause a cough.  Take over-the-counter and prescription medicines only as told by your doctor.  Always cover your mouth when you cough.  Contact a doctor if you have new symptoms or you have a cough that does not get better or gets worse. This information is not intended to replace advice given to you by your health care provider. Make sure you discuss any questions you have with your health care provider. Document Revised: 03/16/2019 Document Reviewed: 02/13/2018 Elsevier Patient Education  2021 Elsevier Inc.    If you have lab work done today you will be contacted with your lab results within the next 2 weeks.  If you have not heard from us then please   contact us. The fastest way to get your results is to register for My Chart.   IF you received an x-ray today, you will receive an invoice from [facility]. Please contact [facility] at [phone] with questions or concerns regarding your invoice.   IF you received labwork today, you will receive an invoice from [facility]. Please contact [facility] at [phone] with questions or concerns regarding your invoice.   Our billing staff will not be able to assist you with questions regarding bills from these companies.  You will be contacted with the lab results as  soon as they are available. The fastest way to get your results is to activate your My Chart account. Instructions are located on the last page of this paperwork. If you have not heard from us regarding the results in 2 weeks, please contact this office.      

## 2020-03-26 ENCOUNTER — Ambulatory Visit (INDEPENDENT_AMBULATORY_CARE_PROVIDER_SITE_OTHER): Payer: Medicare Other | Admitting: Family Medicine

## 2020-03-26 ENCOUNTER — Encounter: Payer: Self-pay | Admitting: Family Medicine

## 2020-03-26 ENCOUNTER — Other Ambulatory Visit: Payer: Self-pay

## 2020-03-26 VITALS — BP 154/90 | HR 78 | Temp 98.3°F | Ht 63.0 in | Wt 156.0 lb

## 2020-03-26 DIAGNOSIS — I10 Essential (primary) hypertension: Secondary | ICD-10-CM

## 2020-03-26 DIAGNOSIS — Z23 Encounter for immunization: Secondary | ICD-10-CM | POA: Diagnosis not present

## 2020-03-26 DIAGNOSIS — E785 Hyperlipidemia, unspecified: Secondary | ICD-10-CM | POA: Diagnosis not present

## 2020-03-26 DIAGNOSIS — E559 Vitamin D deficiency, unspecified: Secondary | ICD-10-CM | POA: Diagnosis not present

## 2020-03-26 DIAGNOSIS — L409 Psoriasis, unspecified: Secondary | ICD-10-CM

## 2020-03-26 DIAGNOSIS — L299 Pruritus, unspecified: Secondary | ICD-10-CM

## 2020-03-26 MED ORDER — VALSARTAN 80 MG PO TABS: 80.0000 mg | ORAL_TABLET | Freq: Every day | ORAL | 3 refills | Status: DC

## 2020-03-26 NOTE — Patient Instructions (Addendum)
  Hypertension, Adult Hypertension is another name for high blood pressure. High blood pressure forces your heart to work harder to pump blood. This can cause problems over time. There are two numbers in a blood pressure reading. There is a top number (systolic) over a bottom number (diastolic). It is best to have a blood pressure that is below 120/80. Healthy choices can help lower your blood pressure, or you may need medicine to help lower it. What are the causes? The cause of this condition is not known. Some conditions may be related to high blood pressure. What increases the risk?  Smoking.  Having type 2 diabetes mellitus, high cholesterol, or both.  Not getting enough exercise or physical activity.  Being overweight.  Having too much fat, sugar, calories, or salt (sodium) in your diet.  Drinking too much alcohol.  Having long-term (chronic) kidney disease.  Having a family history of high blood pressure.  Age. Risk increases with age.  Race. You may be at higher risk if you are African American.  Gender. Men are at higher risk than women before age 45. After age 65, women are at higher risk than men.  Having obstructive sleep apnea.  Stress. What are the signs or symptoms?  High blood pressure may not cause symptoms. Very high blood pressure (hypertensive crisis) may cause: ? Headache. ? Feelings of worry or nervousness (anxiety). ? Shortness of breath. ? Nosebleed. ? A feeling of being sick to your stomach (nausea). ? Throwing up (vomiting). ? Changes in how you see. ? Very bad chest pain. ? Seizures. How is this treated?  This condition is treated by making healthy lifestyle changes, such as: ? Eating healthy foods. ? Exercising more. ? Drinking less alcohol.  Your health care provider may prescribe medicine if lifestyle changes are not enough to get your blood pressure under control, and if: ? Your top number is above 130. ? Your bottom number is  above 80.  Your personal target blood pressure may vary. Follow these instructions at home: Eating and drinking  If told, follow the DASH eating plan. To follow this plan: ? Fill one half of your plate at each meal with fruits and vegetables. ? Fill one fourth of your plate at each meal with whole grains. Whole grains include whole-wheat pasta, brown rice, and whole-grain bread. ? Eat or drink low-fat dairy products, such as skim milk or low-fat yogurt. ? Fill one fourth of your plate at each meal with low-fat (lean) proteins. Low-fat proteins include fish, chicken without skin, eggs, beans, and tofu. ? Avoid fatty meat, cured and processed meat, or chicken with skin. ? Avoid pre-made or processed food.  Eat less than 1,500 mg of salt each day.  Do not drink alcohol if: ? Your doctor tells you not to drink. ? You are pregnant, may be pregnant, or are planning to become pregnant.  If you drink alcohol: ? Limit how much you use to:  0-1 drink a day for women.  0-2 drinks a day for men. ? Be aware of how much alcohol is in your drink. In the [place] one drink equals one 12 oz bottle of beer (355 mL), one 5 oz glass of wine (148 mL), or one 1 oz glass of hard liquor (44 mL).   Lifestyle  Work with your doctor to stay at a healthy weight or to lose weight. Ask your doctor what the best weight is for you.  Get at least 30 minutes of   exercise most days of the week. This may include walking, swimming, or biking.  Get at least 30 minutes of exercise that strengthens your muscles (resistance exercise) at least 3 days a week. This may include lifting weights or doing Pilates.  Do not use any products that contain nicotine or tobacco, such as cigarettes, e-cigarettes, and chewing tobacco. If you need help quitting, ask your doctor.  Check your blood pressure at home as told by your doctor.  Keep all follow-up visits as told by your doctor. This is important.   Medicines  Take  over-the-counter and prescription medicines only as told by your doctor. Follow directions carefully.  Do not skip doses of blood pressure medicine. The medicine does not work as well if you skip doses. Skipping doses also puts you at risk for problems.  Ask your doctor about side effects or reactions to medicines that you should watch for. Contact a doctor if you:  Think you are having a reaction to the medicine you are taking.  Have headaches that keep coming back (recurring).  Feel dizzy.  Have swelling in your ankles.  Have trouble with your vision. Get help right away if you:  Get a very bad headache.  Start to feel mixed up (confused).  Feel weak or numb.  Feel faint.  Have very bad pain in your: ? Chest. ? Belly (abdomen).  Throw up more than once.  Have trouble breathing. Summary  Hypertension is another name for high blood pressure.  High blood pressure forces your heart to work harder to pump blood.  For most people, a normal blood pressure is less than 120/80.  Making healthy choices can help lower blood pressure. If your blood pressure does not get lower with healthy choices, you may need to take medicine. This information is not intended to replace advice given to you by your health care provider. Make sure you discuss any questions you have with your health care provider. Document Revised: 10/05/2017 Document Reviewed: 10/05/2017 Elsevier Patient Education  2021 Elsevier Inc.   If you have lab work done today you will be contacted with your lab results within the next 2 weeks.  If you have not heard from us then please contact us. The fastest way to get your results is to register for My Chart.   IF you received an x-ray today, you will receive an invoice from [facility]. Please contact [facility] at [phone] with questions or concerns regarding your invoice.   IF you received labwork today, you will receive an invoice from  [facility]. Please contact [facility] at [phone] with questions or concerns regarding your invoice.   Our billing staff will not be able to assist you with questions regarding bills from these companies.  You will be contacted with the lab results as soon as they are available. The fastest way to get your results is to activate your My Chart account. Instructions are located on the last page of this paperwork. If you have not heard from us regarding the results in 2 weeks, please contact this office.      

## 2020-03-26 NOTE — Progress Notes (Signed)
2/16/202210:13 AM  Sharla Wonda Cerise [birth year], 70 y.o., female [id number]  Chief Complaint  Patient presents with  . Follow-up    URI - has completed prescribed meds and uses prn sleep aid   . left side head itching     Dry skin chronic     HPI:   Patient is a 70 y.o. female with past medical history significant for HLD who presents today for routine follow up  Was seen for a cough on 1/19 via telemed Given prednisone and albuterol inhaler Feeling much better with cough Still having some phlegm in the am Rarely needing to use the inhaler: this does help cetrizine occasionally at night  Has never been on medications for BP Works out daily Doesn't take BP at home Has a BP cuff at home Cooks frequently  BP Readings from Last 3 Encounters:  03/26/20 (!) 154/90  01/06/20 (!) 187/83  06/20/19 (!) 158/84    Has issues having a dry and itchy scalp Has tried different creams In the past treated for scabies   Depression screen [facility] 2/9 03/26/2020 02/27/2020 05/21/2019  Decreased Interest 0 0 0  Down, Depressed, Hopeless 0 0 0  PHQ - 2 Score 0 0 0    Fall Risk  03/26/2020 02/27/2020 05/21/2019 10/31/2018 06/02/2018  Falls in the past year? 0 0 0 0 0  Number falls in past yr: 0 0 0 0 -  Injury with Fall? 0 0 0 0 -  Follow up Falls evaluation completed Falls evaluation completed Falls evaluation completed;Education provided Falls evaluation completed -     No Known Allergies  Prior to Admission medications   Medication Sig Start Date End Date Taking? Authorizing Provider  albuterol (VENTOLIN HFA) 108 (90 Base) MCG/ACT inhaler 1-2 puffs q 4-6 hours prn for cough. 02/27/20  Yes Jadis Mika, Laurita Quint, FNP  Calcium Carbonate-Vitamin D (CALCIUM + D PO) Take 2 tablets by mouth every evening. Reported on 06/05/2015   Yes [provider]  cetirizine (ZYRTEC) 10 MG tablet Take 1 tablet (10 mg total) by mouth at bedtime. 02/27/20  Yes Suzette Flagler, Laurita Quint, FNP  Multiple Vitamin  (MULTIVITAMIN) tablet Take 1 tablet by mouth daily.   Yes [provider]  loratadine (CLARITIN) 10 MG tablet Take 1 tablet (10 mg total) by mouth daily. Patient not taking: No sig reported 06/05/18 01/06/20  Forrest Moron, MD    Past Medical History:  Diagnosis Date  . Cough 10/31/2018  . Osteopenia   . Vitamin D deficiency     Past Surgical History:  Procedure Laterality Date  . ABDOMINAL HYSTERECTOMY    . BREAST BIOPSY Left    Excisional   . COLONOSCOPY  2004   Dr.Martin Wynetta Emery  . TUBAL LIGATION      Social History   Tobacco Use  . Smoking status: Never Smoker  . Smokeless tobacco: Never Used  Substance Use Topics  . Alcohol use: Yes    Comment: socially, occ.    Family History  Problem Relation Age of Onset  . Lymphoma Mother   . Cancer Father   . Hypertension Sister   . Hypertension Daughter   . Cancer Maternal Grandmother   . Diabetes Maternal Grandfather   . Cancer Paternal Grandmother   . Colon cancer Neg Hx   . Esophageal cancer Neg Hx   . Rectal cancer Neg Hx   . Stomach cancer Neg Hx   . Colon polyps Neg Hx     Review of Systems  Constitutional: Negative for chills, fever and malaise/fatigue.  Eyes: Negative for blurred vision and double vision.  Respiratory: Positive for cough. Negative for hemoptysis, sputum production, shortness of breath and wheezing.   Cardiovascular: Negative for chest pain, palpitations and leg swelling.  Gastrointestinal: Negative for abdominal pain, blood in stool, constipation, diarrhea, heartburn, nausea and vomiting.  Genitourinary: Negative for dysuria, frequency and hematuria.  Musculoskeletal: Negative for back pain and joint pain.  Skin: Positive for itching (scalp). Negative for rash.  Neurological: Negative for dizziness, weakness and headaches.     OBJECTIVE:  Today's Vitals   03/26/20 0826  BP: (!) 154/90  Pulse: 78  Temp: 98.3 F (36.8 C)  SpO2: 99%  Weight: 156 lb (70.8 kg)  Height: 5'  3" (1.6 m)   Body mass index is 27.63 kg/m.   Physical Exam Constitutional:      General: She is not in acute distress.    Appearance: Normal appearance. She is not ill-appearing.  HENT:     Head: Normocephalic.  Cardiovascular:     Rate and Rhythm: Normal rate and regular rhythm.     Pulses: Normal pulses.     Heart sounds: Normal heart sounds. No murmur heard. No friction rub. No gallop.   Pulmonary:     Effort: Pulmonary effort is normal. No respiratory distress.     Breath sounds: Normal breath sounds. No stridor. No wheezing, rhonchi or rales.  Abdominal:     General: Bowel sounds are normal.     Palpations: Abdomen is soft.     Tenderness: There is no abdominal tenderness.  Musculoskeletal:     Right lower leg: No edema.     Left lower leg: No edema.  Skin:    General: Skin is warm and dry.     Comments: Dry flakiness noted to scalp  Neurological:     Mental Status: She is alert and oriented to person, place, and time.  Psychiatric:        Mood and Affect: Mood normal.        Behavior: Behavior normal.     No results found for this or any previous visit (from the past 24 hour(s)).  No results found.   ASSESSMENT and PLAN  Problem List Items Addressed This Visit      Other   Dyslipidemia   Relevant Orders   Lipid Panel   Vitamin D deficiency   Relevant Orders   Vitamin D, 25-hydroxy    Other Visit Diagnoses    Encounter for immunization    -  Primary   Need for immunization against influenza       Relevant Orders   Flu Vaccine QUAD High Dose(Fluad) (Completed)   Essential hypertension       Relevant Medications   valsartan (DIOVAN) 80 MG tablet   Other Relevant Orders   CMP14+EGFR   Amb ref to Medical Nutrition Therapy-MNT   Psoriasis of scalp       Relevant Orders   CBC   TSH   Pruritus, unspecified        Relevant Orders   TSH       Plan  Take Blood pressure daily at home, Send a picture on mychart in 2 weeks  Goal BP <  130/80  Take Valsartan 68m daily, r/se/b discussed  Salicylic acid for hair, oil based products for moisture  Limit steroid use  Will follow up with lab results  Continue current regimen for cough   Return in about 3  months (around 06/23/2020).    Huston Foley Safiyah Cisney, FNP-BC Primary Care at [facility] [place], [place] [address] Ph.  [phone] Fax [phone]

## 2020-03-27 LAB — LIPID PANEL
Chol/HDL Ratio: 2.2 ratio (ref 0.0–4.4)
Cholesterol, Total: 239 mg/dL — ABNORMAL HIGH (ref 100–199)
HDL: 108 mg/dL (ref >39)
LDL Chol Calc (NIH): 120 mg/dL — ABNORMAL HIGH (ref 0–99)
Triglycerides: 67 mg/dL (ref 0–149)
VLDL Cholesterol Cal: 11 mg/dL (ref 5–40)

## 2020-03-27 LAB — CBC
Hematocrit: 40.4 % (ref 34.0–46.6)
Hemoglobin: 13.4 g/dL (ref 11.1–15.9)
MCH: 28.6 pg (ref 26.6–33.0)
MCHC: 33.2 g/dL (ref 31.5–35.7)
MCV: 86 fL (ref 79–97)
Platelets: 194 10*3/uL (ref 150–450)
RBC: 4.69 x10E6/uL (ref 3.77–5.28)
RDW: 12.6 % (ref 11.7–15.4)
WBC: 3.6 10*3/uL (ref 3.4–10.8)

## 2020-03-27 LAB — TSH: TSH: 1.35 u[IU]/mL (ref 0.450–4.500)

## 2020-03-27 LAB — CMP14+EGFR
ALT: 21 IU/L (ref 0–32)
AST: 21 IU/L (ref 0–40)
Albumin/Globulin Ratio: 1.6 (ref 1.2–2.2)
Albumin: 4.5 g/dL (ref 3.8–4.8)
Alkaline Phosphatase: 86 IU/L (ref 44–121)
BUN/Creatinine Ratio: 25 (ref 12–28)
BUN: 17 mg/dL (ref 8–27)
Bilirubin Total: 0.6 mg/dL (ref 0.0–1.2)
CO2: 23 mmol/L (ref 20–29)
Calcium: 9.5 mg/dL (ref 8.7–10.3)
Chloride: 102 mmol/L (ref 96–106)
Creatinine, Ser: 0.69 mg/dL (ref 0.57–1.00)
GFR calc Af Amer: 102 mL/min/{1.73_m2} (ref >59)
GFR calc non Af Amer: 88 mL/min/{1.73_m2} (ref >59)
Globulin, Total: 2.8 g/dL (ref 1.5–4.5)
Glucose: 86 mg/dL (ref 65–99)
Potassium: 4.2 mmol/L (ref 3.5–5.2)
Sodium: 142 mmol/L (ref 134–144)
Total Protein: 7.3 g/dL (ref 6.0–8.5)

## 2020-03-27 LAB — VITAMIN D 25 HYDROXY (VIT D DEFICIENCY, FRACTURES): Vit D, 25-Hydroxy: 28.9 ng/mL — ABNORMAL LOW (ref 30.0–100.0)

## 2020-04-01 DIAGNOSIS — Z23 Encounter for immunization: Secondary | ICD-10-CM | POA: Diagnosis not present

## 2020-06-20 ENCOUNTER — Emergency Department (HOSPITAL_BASED_OUTPATIENT_CLINIC_OR_DEPARTMENT_OTHER): Payer: Medicare Other

## 2020-06-20 ENCOUNTER — Encounter (HOSPITAL_BASED_OUTPATIENT_CLINIC_OR_DEPARTMENT_OTHER): Payer: Self-pay

## 2020-06-20 ENCOUNTER — Other Ambulatory Visit: Payer: Self-pay

## 2020-06-20 ENCOUNTER — Telehealth: Payer: Self-pay | Admitting: Family Medicine

## 2020-06-20 ENCOUNTER — Emergency Department (HOSPITAL_BASED_OUTPATIENT_CLINIC_OR_DEPARTMENT_OTHER)
Admission: EM | Admit: 2020-06-20 | Discharge: 2020-06-20 | Disposition: A | Discharge status: Home / Self Care | Payer: Medicare Other | Attending: Emergency Medicine | Admitting: Emergency Medicine

## 2020-06-20 DIAGNOSIS — M7989 Other specified soft tissue disorders: Secondary | ICD-10-CM | POA: Diagnosis not present

## 2020-06-20 DIAGNOSIS — M79604 Pain in right leg: Secondary | ICD-10-CM | POA: Insufficient documentation

## 2020-06-20 DIAGNOSIS — R200 Anesthesia of skin: Secondary | ICD-10-CM | POA: Diagnosis not present

## 2020-06-20 NOTE — ED Notes (Signed)
ED Provider at bedside. 

## 2020-06-20 NOTE — ED Provider Notes (Signed)
[facility] EMERGENCY DEPARTMENT Provider Note   CSN: [id number] Arrival date & time: 06/20/20  0944     History Chief Complaint  Patient presents with  . Knee Pain    Kathleen Douglas is a 70 y.o. female.  Patient is here with right leg swelling and pain at the popliteal fossa.  She states that she had an unusually long shift couple weeks ago and the pain and swelling is gotten worse since then.  She denies any joint pain.  She denies any bony tenderness she is able to ambulate.  She is here with concern for DVT.  She is reticulocyte also be Baker's cyst.  She has a history of clot.  She had no infection recently.  She comments on some intermittent numbness of the dorsum of her foot but not persistent.  No color change.        Past Medical History:  Diagnosis Date  . Cough 10/31/2018  . Osteopenia   . Vitamin D deficiency     Patient Active Problem List   Diagnosis Date Noted  . Cough 10/31/2018  . Dyslipidemia 03/03/2016  . Vitamin D deficiency 03/03/2016    Past Surgical History:  Procedure Laterality Date  . ABDOMINAL HYSTERECTOMY    . BREAST BIOPSY Left    Excisional   . COLONOSCOPY  2004   Dr.Martin Wynetta Emery  . TUBAL LIGATION       OB History   No obstetric history on file.     Family History  Problem Relation Age of Onset  . Lymphoma Mother   . Cancer Father   . Hypertension Sister   . Hypertension Daughter   . Cancer Maternal Grandmother   . Diabetes Maternal Grandfather   . Cancer Paternal Grandmother   . Colon cancer Neg Hx   . Esophageal cancer Neg Hx   . Rectal cancer Neg Hx   . Stomach cancer Neg Hx   . Colon polyps Neg Hx     Social History   Tobacco Use  . Smoking status: Never Smoker  . Smokeless tobacco: Never Used  Vaping Use  . Vaping Use: Never used  Substance Use Topics  . Alcohol use: Yes    Comment: socially, occ.  . Drug use: No    Home Medications Prior to Admission medications   Medication Sig  Start Date End Date Taking? Authorizing Provider  Calcium Carbonate-Vitamin D (CALCIUM + D PO) Take 2 tablets by mouth every evening. Reported on 06/05/2015   Yes [provider]  Multiple Vitamin (MULTIVITAMIN) tablet Take 1 tablet by mouth daily.   Yes [provider]  valsartan (DIOVAN) 80 MG tablet Take 1 tablet (80 mg total) by mouth daily. 03/26/20  Yes Just, Laurita Quint, FNP  albuterol (VENTOLIN HFA) 108 (90 Base) MCG/ACT inhaler 1-2 puffs q 4-6 hours prn for cough. 02/27/20   Just, Laurita Quint, FNP  cetirizine (ZYRTEC) 10 MG tablet Take 1 tablet (10 mg total) by mouth at bedtime. 02/27/20   Just, Laurita Quint, FNP  loratadine (CLARITIN) 10 MG tablet Take 1 tablet (10 mg total) by mouth daily. Patient not taking: No sig reported 06/05/18 01/06/20  Forrest Moron, MD    Allergies    Patient has no known allergies.  Review of Systems   Review of Systems  Constitutional: Negative for chills and fever.  HENT: Negative for congestion and rhinorrhea.   Respiratory: Negative for cough and shortness of breath.   Cardiovascular: Positive for leg swelling.  Negative for chest pain and palpitations.  Gastrointestinal: Negative for diarrhea, nausea and vomiting.  Genitourinary: Negative for difficulty urinating and dysuria.  Musculoskeletal: Positive for myalgias. Negative for arthralgias and back pain.  Skin: Negative for rash and wound.  Neurological: Negative for light-headedness and headaches.    Physical Exam Updated Vital Signs BP 128/72   Pulse 63   Temp 98.2 F (36.8 C) (Oral)   Resp 18   Ht 5\' 3"  (1.6 m)   Wt 69.9 kg   SpO2 100%   BMI 27.28 kg/m   Physical Exam Vitals and nursing note reviewed. Exam conducted with a chaperone present.  Constitutional:      General: She is not in acute distress.    Appearance: Normal appearance.  HENT:     Head: Normocephalic and atraumatic.     Nose: No rhinorrhea.  Eyes:     General:        Right eye: No discharge.         Left eye: No discharge.     Conjunctiva/sclera: Conjunctivae normal.  Cardiovascular:     Rate and Rhythm: Normal rate and regular rhythm.  Pulmonary:     Effort: Pulmonary effort is normal. No respiratory distress.     Breath sounds: No stridor.  Abdominal:     General: Abdomen is flat. There is no distension.     Palpations: Abdomen is soft.  Musculoskeletal:        General: Tenderness present. No signs of injury.     Comments: Some mild tenderness to palpation of the popliteal fossa on the right, no significant swelling or pitting on the extremity compared to the right.  Strong pulses neurovascularly intact no bony tenderness  Skin:    General: Skin is warm and dry.  Neurological:     General: No focal deficit present.     Mental Status: She is alert. Mental status is at baseline.     Motor: No weakness.  Psychiatric:        Mood and Affect: Mood normal.        Behavior: Behavior normal.     ED Results / Procedures / Treatments   Labs (all labs ordered are listed, but only abnormal results are displayed) Labs Reviewed - No data to display  EKG None  Radiology US Venous Img Lower Unilateral Right  Result Date: 06/20/2020 CLINICAL DATA:  Posterior fossa swelling and pain. Evaluate for DVT. Possible Baker cyst. EXAM: RIGHT LOWER EXTREMITY VENOUS DOPPLER ULTRASOUND TECHNIQUE: Gray-scale sonography with compression, as well as color and duplex ultrasound, were performed to evaluate the deep venous system(s) from the level of the common femoral vein through the popliteal and proximal calf veins. COMPARISON:  None. FINDINGS: VENOUS Normal compressibility of the common femoral, superficial femoral, and popliteal veins, as well as the visualized calf veins. Visualized portions of profunda femoral vein and great saphenous vein unremarkable. No filling defects to suggest DVT on grayscale or color Doppler imaging. Doppler waveforms show normal direction of venous flow, normal respiratory  plasticity and response to augmentation. Limited views of the contralateral common femoral vein are unremarkable. OTHER No Baker's cyst is demonstrated. Limitations: none IMPRESSION: Negative. Electronically Signed   By: Franki Cabot M.D.   On: 06/20/2020 11:23    Procedures Procedures   Medications Ordered in ED Medications - No data to display  ED Course  I have reviewed the triage vital signs and the nursing notes.  Pertinent labs & imaging results that were available  during my care of the patient were reviewed by me and considered in my medical decision making (see chart for details).    MDM Rules/Calculators/A&P                          Eval for leg swelling, likely Baker's cyst however will rule out DVT.  Otherwise patient is healthy only hypertension is a problem and good outpatient follow-up.  DVT study reviewed by radiology is unremarkable.  No focal cause for this patient discomfort mild swelling.  No signs of cellulitis, clinical course not supportive of infection either.  Possible to swelling from overuse.  Told to follow-up with primary care and return as needed  Final Clinical Impression(s) / ED Diagnoses Final diagnoses:  Right leg pain    Rx / DC Orders ED Discharge Orders    None       Breck Coons, MD 06/20/20 1152

## 2020-06-20 NOTE — Telephone Encounter (Signed)
She is no longer a patient of Kathleen Douglas.

## 2020-06-20 NOTE — Telephone Encounter (Signed)
Patient is needing to get an Xray for a possible blood clot - she was a patient of Kelsea Just - she will need an order for this Xray.  Please Advise if Dr. Carlota Raspberry would be willing to take responsibility for this.  Patient is waiting for a call.

## 2020-06-20 NOTE — ED Triage Notes (Signed)
R knee pain/swelling more so posteriorly x 3 weeks after "2 long shifts on my feet".  Pt does endorse some warmth to her knee cap recently.  Denies any recent travel or SOB/CP.

## 2020-06-20 NOTE — ED Notes (Signed)
Pt c/o of warmness and pain in the front and back of knee. Like a cramping feel. Feels numbness and tingling on top of right foot and toes.

## 2020-06-20 NOTE — ED Notes (Signed)
US at bedside

## 2020-06-24 ENCOUNTER — Ambulatory Visit: Payer: Medicare Other | Admitting: Family Medicine

## 2020-06-25 ENCOUNTER — Ambulatory Visit (INDEPENDENT_AMBULATORY_CARE_PROVIDER_SITE_OTHER): Payer: Medicare Other | Admitting: Specialist

## 2020-06-25 ENCOUNTER — Encounter: Payer: Self-pay | Admitting: Specialist

## 2020-06-25 ENCOUNTER — Other Ambulatory Visit: Payer: Self-pay

## 2020-06-25 ENCOUNTER — Ambulatory Visit: Payer: Self-pay

## 2020-06-25 VITALS — BP 153/91 | HR 70 | Ht 63.0 in | Wt 156.0 lb

## 2020-06-25 DIAGNOSIS — M7631 Iliotibial band syndrome, right leg: Secondary | ICD-10-CM | POA: Diagnosis not present

## 2020-06-25 DIAGNOSIS — G8929 Other chronic pain: Secondary | ICD-10-CM | POA: Diagnosis not present

## 2020-06-25 DIAGNOSIS — M25561 Pain in right knee: Secondary | ICD-10-CM | POA: Diagnosis not present

## 2020-06-25 DIAGNOSIS — M222X1 Patellofemoral disorders, right knee: Secondary | ICD-10-CM | POA: Diagnosis not present

## 2020-06-25 DIAGNOSIS — M6750 Plica syndrome, unspecified knee: Secondary | ICD-10-CM | POA: Diagnosis not present

## 2020-06-25 MED ORDER — LIDOCAINE HCL 1 % IJ SOLN
2.0000 mL | INTRAMUSCULAR | Status: AC | PRN
  Administered 2020-06-25: 2 mL

## 2020-06-25 MED ORDER — BUPIVACAINE HCL 0.25 % IJ SOLN
2.0000 mL | INTRAMUSCULAR | Status: AC | PRN
  Administered 2020-06-25: 2 mL via INTRA_ARTICULAR

## 2020-06-25 MED ORDER — METHYLPREDNISOLONE ACETATE 40 MG/ML IJ SUSP
40.0000 mg | INTRAMUSCULAR | Status: AC | PRN
  Administered 2020-06-25: 40 mg via INTRA_ARTICULAR

## 2020-06-25 MED ORDER — IBUPROFEN 600 MG PO TABS: 600.0000 mg | ORAL_TABLET | Freq: Four times a day (QID) | ORAL | 2 refills | Status: DC | PRN

## 2020-06-25 NOTE — Progress Notes (Signed)
Office Visit Note   Patient: Kathleen Douglas           Date of Birth: 1950-09-07           MRN: [id number] Visit Date: 06/25/2020              Requested by: Wendie Agreste, MD [address] [place],  [place] [address] PCP: Wendie Agreste, MD   Assessment & Plan: Visit Diagnoses:  1. Right knee pain, unspecified chronicity   2. Iliotibial band syndrome of right side   3. Plica syndrome   4. Chronic pain of right knee     Plan: The main ways of treat osteoarthritis, that are found to be success. Weight loss helps to decrease pain. Exercise is important to maintaining cartilage and thickness and strengthening. NSAIDs like motrin,voltaren gel are meds decreasing the inflamation. Ice is okay  In afternoon and evening and hot shower in the am. Physical therapy right knee for plicae, ITB syndrome and patellofemoral pain.   Follow-Up Instructions: No follow-ups on file.   Orders:  Orders Placed This Encounter  Procedures  . XR Knee 1-2 Views Right   No orders of the defined types were placed in this encounter.     Procedures: Large Joint Inj: R knee on 06/25/2020 1:24 PM Indications: pain Details: 25 G 1.5 in needle, anterolateral approach  Arthrogram: No  Medications: 40 mg methylPREDNISolone acetate 40 MG/ML; 2 mL lidocaine 1 %; 2 mL bupivacaine 0.25 % Outcome: tolerated well, no immediate complications Procedure, treatment alternatives, risks and benefits explained, specific risks discussed. Consent was given by the patient. Immediately prior to procedure a time out was called to verify the correct patient, procedure, equipment, support staff and site/side marked as required. Patient was prepped and draped in the usual sterile fashion.       Clinical Data: No additional findings.   Subjective: Chief Complaint  Patient presents with  . Right Knee - Pain    70 year old female with history of right knee pain for 2 weeks or longer with pain over the  right posterior knee and right anterolateral knee. She has pain with prolong standing and walking with pain. Feels better with walking for exercise. She has stiffness with standing and walking. Notices swelling right knee and went to the ER on 68 and an U/S was done.   Review of Systems  Constitutional: Negative.   HENT: Negative.   Eyes: Negative.   Respiratory: Negative.   Cardiovascular: Negative.   Gastrointestinal: Negative.   Endocrine: Negative.   Genitourinary: Negative.   Musculoskeletal: Negative.   Skin: Negative.   Allergic/Immunologic: Negative.   Neurological: Negative.   Hematological: Negative.   Psychiatric/Behavioral: Negative.      Objective: Vital Signs: BP (!) 153/91 (BP Location: Left Arm, Patient Position: Sitting)   Pulse 70   Ht 5\' 3"  (1.6 m)   Wt 156 lb (70.8 kg)   BMI 27.63 kg/m   Physical Exam Constitutional:      Appearance: She is well-developed.  HENT:     Head: Normocephalic and atraumatic.  Eyes:     Pupils: Pupils are equal, round, and reactive to light.  Pulmonary:     Effort: Pulmonary effort is normal.     Breath sounds: Normal breath sounds.  Abdominal:     General: Bowel sounds are normal.     Palpations: Abdomen is soft.  Musculoskeletal:        General: Normal range  of motion.     Cervical back: Normal range of motion and neck supple.  Skin:    General: Skin is warm and dry.  Neurological:     Mental Status: She is alert and oriented to person, place, and time.  Psychiatric:        Behavior: Behavior normal.        Thought Content: Thought content normal.        Judgment: Judgment normal.     Ortho Exam  Specialty Comments:  No specialty comments available.  Imaging: No results found.   PMFS History: Patient Active Problem List   Diagnosis Date Noted  . Cough 10/31/2018  . Dyslipidemia 03/03/2016  . Vitamin D deficiency 03/03/2016   Past Medical History:  Diagnosis Date  . Cough 10/31/2018  . Osteopenia    . Vitamin D deficiency     Family History  Problem Relation Age of Onset  . Lymphoma Mother   . Cancer Father   . Hypertension Sister   . Hypertension Daughter   . Cancer Maternal Grandmother   . Diabetes Maternal Grandfather   . Cancer Paternal Grandmother   . Colon cancer Neg Hx   . Esophageal cancer Neg Hx   . Rectal cancer Neg Hx   . Stomach cancer Neg Hx   . Colon polyps Neg Hx     Past Surgical History:  Procedure Laterality Date  . ABDOMINAL HYSTERECTOMY    . BREAST BIOPSY Left    Excisional   . COLONOSCOPY  2004   Dr.Martin Wynetta Emery  . TUBAL LIGATION     Social History   Occupational History  . Not on file  Tobacco Use  . Smoking status: Never Smoker  . Smokeless tobacco: Never Used  Vaping Use  . Vaping Use: Never used  Substance and Sexual Activity  . Alcohol use: Yes    Comment: socially, occ.  . Drug use: No  . Sexual activity: Yes    Birth control/protection: None

## 2020-06-25 NOTE — Patient Instructions (Signed)
Plan: The main ways of treat osteoarthritis, that are found to be success. Weight loss helps to decrease pain. Exercise is important to maintaining cartilage and thickness and strengthening. NSAIDs like motrin,voltaren gel are meds decreasing the inflamation. Ice is okay  In afternoon and evening and hot shower in the am. Physical therapy right knee for plicae, ITB syndrome and patellofemoral pain.   Follow-Up Instructions: No follow-ups on file.

## 2020-07-08 ENCOUNTER — Encounter: Payer: Self-pay | Admitting: Physical Therapy

## 2020-07-08 ENCOUNTER — Ambulatory Visit (INDEPENDENT_AMBULATORY_CARE_PROVIDER_SITE_OTHER): Payer: Medicare Other | Admitting: Physical Therapy

## 2020-07-08 ENCOUNTER — Other Ambulatory Visit: Payer: Self-pay

## 2020-07-08 DIAGNOSIS — R262 Difficulty in walking, not elsewhere classified: Secondary | ICD-10-CM

## 2020-07-08 DIAGNOSIS — M25561 Pain in right knee: Secondary | ICD-10-CM | POA: Diagnosis not present

## 2020-07-08 DIAGNOSIS — M6281 Muscle weakness (generalized): Secondary | ICD-10-CM | POA: Diagnosis not present

## 2020-07-08 NOTE — Patient Instructions (Signed)
Access Code: [email or web address] URL: [email or web address] Date: 07/08/2020 Prepared by: Daleen Bo  Exercises Supine Quad Set - 2 x daily - 7 x weekly - 2 sets - 10 reps - 3 hold Seated Hamstring Stretch - 2 x daily - 7 x weekly - 1 sets - 3 reps - 30 hold Sitting Heel Slide with Towel - 2 x daily - 7 x weekly - 2 sets - 10 reps - 5 hold

## 2020-07-08 NOTE — Therapy (Signed)
[facility] [address] [place], [place], [address] Phone: [phone]   Fax:  [phone]  Physical Therapy Evaluation  Patient Details  Name: Kathleen Douglas MRN: [id number] Date of Birth: 12/27/1950 Referring Provider (PT): Dr. Basil Dess   Encounter Date: 07/08/2020   PT End of Session - 07/08/20 1541    Visit Number 1    Number of Visits 13    Date for PT Re-Evaluation 10/06/20    Authorization Type Medicare    PT Start Time 6063    PT Stop Time 1430    PT Time Calculation (min) 45 min    Activity Tolerance Patient tolerated treatment well;No increased pain    Behavior During Therapy WFL for tasks assessed/performed           Past Medical History:  Diagnosis Date  . Cough 10/31/2018  . Osteopenia   . Vitamin D deficiency     Past Surgical History:  Procedure Laterality Date  . ABDOMINAL HYSTERECTOMY    . BREAST BIOPSY Left    Excisional   . COLONOSCOPY  2004   Dr.Martin Wynetta Emery  . TUBAL LIGATION      There were no vitals filed for this visit.    Subjective Assessment - 07/08/20 1349    Subjective She states pain started about about 2 months ago. She has two extremely long days of work and she had pain afterwards. 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 Pt states the R knee hurts and has been giving her trouble with stairs. She stated the knee swelled initially and contiues to daily. She walks every day and feels that the L knee has been used more. Pt states she would like relief of R knee pain. She averages 10k steps a day and tries to stay very active. She has to go through "ranges of motion" to get the knee moving. She has trouble tying her shoes. Aggs: bending, getting in and out of shower, stairs, standing too long; sitting down in a car for too long; Eases: resting, ice, meds. elevating. Pt endorses popping, cracking (feels relief). She denies  feeling of locking or giving way. She has intermittent swelling of the knee. She denies bruising but endorses slight warmth. Current 1/10, Worst 10/10, Best 0/10.    Limitations Sitting;House hold activities;Lifting;Standing;Walking    Patient Stated Goals She wants to stop the pain and swelling and go back to normal activity. Wants to walk with heels during daughter's wedding.    Currently in Pain? Yes    Pain Score 1     Pain Location Knee    Pain Orientation Right    Pain Descriptors / Indicators Sore;Sharp;Aching    Pain Type Acute pain              [facility] PT Assessment - 07/08/20 0001      Assessment   Medical Diagnosis M25.561 (ICD-10-CM) - Right knee pain, unspecified chronicity    Referring Provider (PT) Dr. Basil Dess    Prior Therapy [address]; shoulder      Precautions   Precautions None      Restrictions   Weight Bearing Restrictions No      Balance Screen   Has the patient fallen in the past 6 months No    Has the patient had a decrease in activity level because of a fear of falling?  No    Is the patient reluctant to leave their  home because of a fear of falling?  No      Home Ecologist residence      Prior Function   Level of Independence Independent      Cognition   Overall Cognitive Status Within Functional Limits for tasks assessed      Observation/Other Assessments   Focus on Therapeutic Outcomes (FOTO)  44.31 (60 expected)      Functional Tests   Functional tests Squat;Lunges      Squat   Comments difficulty with eccentric lower, R LE offweight      Lunges   Comments unable to pick up object from ground with R LE WB, required UE support      Posture/Postural Control   Posture/Postural Control Postural limitations    Postural Limitations Increased thoracic kyphosis;Rounded Shoulders;Weight shift left      ROM / Strength   AROM / PROM / Strength AROM;PROM;Strength      AROM   Overall AROM Comments R  flexion 109; 1  ext; L knee WNL      PROM   Overall PROM Comments R knee: 129 flexion; 0 deg ext   at end of session; painful at beginning unable to overpress     Strength   Overall Strength Comments L LE WNL; R LE 4/5 flexion and extension with pain      Flexibility   Soft Tissue Assessment /Muscle Length yes    Hamstrings limited in supine 90/90    Quadriceps positive Ely's    ITB taut to palpation      Palpation   Patella mobility limited in all directions; guarding    Palpation comment hypertonicity and TTP of R quad, and biceps femoris      Special Tests    Special Tests Meniscus Tests;Knee Special Tests    Knee Special tests  other;other2    Meniscus Tests McMurray Test;other      other    Findings Negative    Comments Varus Valgus at 0 and 30      other   findings Negative    Comments Anterior drawer      McMurray Test   Findings Positive      other   Findings Positive    Comments Joint line tenderness medially      Transfers   Five time sit to stand comments  unable to perform without UE support at this time      Ambulation/Gait   Ambulation/Gait Yes    Ambulation Distance (Feet) 35 Feet    Gait Pattern Antalgic;Right flexed knee in stance                      Objective measurements completed on examination: See above findings.       [facility] Adult PT Treatment/Exercise - 07/08/20 0001      Exercises   Exercises Knee/Hip      Knee/Hip Exercises: Stretches   Passive Hamstring Stretch Limitations 30s 3x seated      Knee/Hip Exercises: Supine   Quad Sets Limitations 10x 5s    Other Supine Knee/Hip Exercises supine heel slide with strap 10x 5s      Manual Therapy   Manual therapy comments STM: R quad, VL and rec fem                  PT Education - 07/08/20 1541    Education Details MOI, diagnosis, prognosis, anatomy, exercise progression, DOMS expectations, muscle firing,  HEP, POC    Person(s) Educated Patient    Methods  Explanation;Demonstration;Tactile cues;Verbal cues;Handout    Comprehension Verbalized understanding;Returned demonstration;Verbal cues required;Tactile cues required            PT Short Term Goals - 07/08/20 1600      PT SHORT TERM GOAL #1   Title Pt will become independent with HEP in order to demonstrate synthesis of PT education.    Time 2    Period Weeks    Status New      PT SHORT TERM GOAL #2   Title Pt will be able to demonstrate full depth squat to pick up objects from floor in order to demonstrate functional improvement in LE function for self-care, occupation, and house hold duties.    Time 4    Period Weeks    Status New             PT Long Term Goals - 07/08/20 1643      PT LONG TERM GOAL #1   Title Pt will become independent with final HEP in order to demonstrate synthesis of PT education.    Time 8    Period Weeks    Status New      PT LONG TERM GOAL #2   Title Pt will improve FOTO score to  >/=60 in order to demonstrate functional improvement with R LE function      PT LONG TERM GOAL #3   Title Pt will be able to perform 5XSTS in under 12s in order to demonstrate functional improvement above the cut off score for older adults.    Time 8    Period Weeks    Status New      PT LONG TERM GOAL #4   Title Pt will be able to demonstrate/report ability to stand for extended periods of time without pain in order to demonstrate functional improvement and tolerance to static positioning.    Time 8    Period Weeks    Status New                  Plan - 07/08/20 1543    Clinical Impression Statement Pt is a 70 y.o. female presenting to PT eval today with CC of R knee pain. Pt presents with decreased R knee ROM, moderate knee swelling, knee extensor weakness, and gait deviations. Pt's s/s appear consistent with generalized anterior knee pain/ acute inflammation of the R knee due to overuse injury. Pt's s/s are moderately sensitive and irritable. Clinical  tests do suggest potential for meniscal lesion, though presentation and MOI do not seem likely. Pt's impairments limit her ability to perform ADL and self care. Pt would benefit from continued skilled therapy in order to reach goals and maximize functional R LE strength and ROM for full return to PLOF.    Personal Factors and Comorbidities Age;Profession    Examination-Activity Limitations Lift;Stand;Locomotion Level;Transfers;Squat;Stairs;Carry;Bend    Examination-Participation Restrictions Community Activity;Occupation;Other;Driving;Shop    Stability/Clinical Decision Making Stable/Uncomplicated    Clinical Decision Making Low    Rehab Potential Good    PT Frequency 2x / week    PT Duration 6 weeks    PT Treatment/Interventions ADLs/Self Care Home Management;Aquatic Therapy;Cryotherapy;Electrical Stimulation;Iontophoresis 4mg /ml Dexamethasone;Moist Heat;Traction;Ultrasound;Gait training;Stair training;Functional mobility training;Therapeutic activities;Therapeutic exercise;Balance training;Neuromuscular re-education;Patient/family education;Orthotic Fit/Training;Scar mobilization;Passive range of motion;Dry needling;Taping;Vasopneumatic Device;Joint Manipulations;Spinal Manipulations    PT Next Visit Plan review HEP, banded TKE, bridge, sidestep    PT Home Exercise Plan [phone]    Consulted and  Agree with Plan of Care Patient           Patient will benefit from skilled therapeutic intervention in order to improve the following deficits and impairments:  Abnormal gait,Pain,Improper body mechanics,Decreased mobility,Decreased activity tolerance,Decreased range of motion,Decreased strength,Hypomobility,Impaired flexibility,Increased edema,Difficulty walking,Decreased balance  Visit Diagnosis: Difficulty walking  Pain in joint of right knee  Muscle weakness (generalized)     Problem List Patient Active Problem List   Diagnosis Date Noted  . Cough 10/31/2018  . Dyslipidemia 03/03/2016   . Vitamin D deficiency 03/03/2016    Daleen Bo PT, DPT 07/08/20 6:08 PM   [facility] [address] [place], [place], [address] Phone: [phone]   Fax:  [phone]  Name: Kathleen Douglas MRN: [id number] Date of Birth: [birth year]

## 2020-07-22 ENCOUNTER — Other Ambulatory Visit: Payer: Self-pay

## 2020-07-22 ENCOUNTER — Encounter: Payer: Self-pay | Admitting: Physical Therapy

## 2020-07-22 ENCOUNTER — Ambulatory Visit (INDEPENDENT_AMBULATORY_CARE_PROVIDER_SITE_OTHER): Payer: Medicare Other | Admitting: Physical Therapy

## 2020-07-22 DIAGNOSIS — M6281 Muscle weakness (generalized): Secondary | ICD-10-CM

## 2020-07-22 DIAGNOSIS — R262 Difficulty in walking, not elsewhere classified: Secondary | ICD-10-CM | POA: Diagnosis not present

## 2020-07-22 DIAGNOSIS — M25561 Pain in right knee: Secondary | ICD-10-CM | POA: Diagnosis not present

## 2020-07-22 NOTE — Patient Instructions (Signed)
Access Code: [email or web address] URL: [email or web address] Date: 07/22/2020 Prepared by: Daleen Bo  Exercises Supine Quad Set - 2 x daily - 7 x weekly - 2 sets - 10 reps - 3 hold Seated Hamstring Stretch - 2 x daily - 7 x weekly - 1 sets - 3 reps - 30 hold Supine Hamstring Stretch - 2 x daily - 7 x weekly - 3 sets - 10 reps Sitting Heel Slide with Towel - 2 x daily - 7 x weekly - 2 sets - 10 reps - 5 hold Supine Bridge - 2 x daily - 7 x weekly - 2 sets - 10 reps

## 2020-07-22 NOTE — Therapy (Signed)
[facility] [address] [place], [place], [address] Phone: [phone]   Fax:  [phone]  Physical Therapy Treatment  Patient Details  Name: Kathleen Douglas MRN: [id number] Date of Birth: 08-10-1950 Referring Provider (PT): Dr. Basil Dess   Encounter Date: 07/22/2020   PT End of Session - 07/22/20 1409     Visit Number 2    Number of Visits 13    Date for PT Re-Evaluation 10/06/20    Authorization Type Medicare    PT Start Time 9528    PT Stop Time 1425    PT Time Calculation (min) 40 min    Activity Tolerance Patient tolerated treatment well;No increased pain    Behavior During Therapy [facility] for tasks assessed/performed             Past Medical History:  Diagnosis Date   Cough 10/31/2018   Osteopenia    Vitamin D deficiency     Past Surgical History:  Procedure Laterality Date   ABDOMINAL HYSTERECTOMY     BREAST BIOPSY Left    Excisional    COLONOSCOPY  2004   Dr.Martin Johnson   TUBAL LIGATION      There were no vitals filed for this visit.   Subjective Assessment - 07/22/20 1348     Subjective She states pain is a little better compared to before. She still has trouble with sitting in the car and pushing on the brake.    Limitations Sitting;House hold activities;Lifting;Standing;Walking    Patient Stated Goals She wants to stop the pain and swelling and go back to normal activity. Wants to walk with heels during daughter's wedding.    Currently in Pain? No/denies    Pain Score 0-No pain                               [facility] Adult PT Treatment/Exercise - 07/22/20 0001       Transfers   Five time sit to stand comments  unable to perform without UE support at this time      Ambulation/Gait   Ambulation/Gait Yes    Ambulation Distance (Feet) 35 Feet    Gait Pattern Decreased R stance time, improved from initial eval     Posture/Postural Control   Posture/Postural Control Postural  limitations    Postural Limitations Increased thoracic kyphosis;Rounded Shoulders;Weight shift left      Exercises   Exercises Knee/Hip      Knee/Hip Exercises: Stretches   Passive Hamstring Stretch Limitations 30s 3x seated    Quad Stretch Limitations Gastroc stretch 30s 3x prone with strap  At wall 30s 3x     Knee/Hip Exercises: Supine   Other Supine Knee/Hip Exercises supine heel slide with strap 10x 5s  Bridging 3x10 Quad set 15x 5s hold      Manual Therapy   Manual therapy comments STM: R quad, VL and rec fem                    PT Education - 07/22/20 1409     Education Details anatomy, exercise progression, DOMS expectations, muscle firing, HEP update    Person(s) Educated Patient    Methods Explanation;Demonstration;Tactile cues;Verbal cues;Handout    Comprehension Verbalized understanding;Returned demonstration;Verbal cues required;Tactile cues required              PT Short Term Goals - 07/08/20 1600       PT SHORT TERM GOAL #1  Title Pt will become independent with HEP in order to demonstrate synthesis of PT education.    Time 2    Period Weeks    Status New      PT SHORT TERM GOAL #2   Title Pt will be able to demonstrate full depth squat to pick up objects from floor in order to demonstrate functional improvement in LE function for self-care, occupation, and house hold duties.    Time 4    Period Weeks    Status New               PT Long Term Goals - 07/08/20 1643       PT LONG TERM GOAL #1   Title Pt will become independent with final HEP in order to demonstrate synthesis of PT education.    Time 8    Period Weeks    Status New      PT LONG TERM GOAL #2   Title Pt will improve FOTO score to  >/=60 in order to demonstrate functional improvement with R LE function      PT LONG TERM GOAL #3   Title Pt will be able to perform 5XSTS in under 12s in order to demonstrate functional improvement above the cut off score for older  adults.    Time 8    Period Weeks    Status New      PT LONG TERM GOAL #4   Title Pt will be able to demonstrate/report ability to stand for extended periods of time without pain in order to demonstrate functional improvement and tolerance to static positioning.    Time 8    Period Weeks    Status New                   Plan - 07/22/20 1411     Clinical Impression Statement Pt presents with R quad and HS hypertonicity that was improved with STM. Pt had improved reported pain sx as well as improved knee ROM following manual. Pt was able to perform HEP but required cuing for positioning and duration of holds during review. Pt was able to progress HEP to include quad stretching as well as bridging without increased pain. Pt presents with increased posterior thigh and shank muscle tightness that is likely contributing to her pain with sustained knee flexion and static positioning. Pt would benefit from continued skilled therapy in order to reach goals and maximize functional R LE strength and ROM for full return to PLOF.    Personal Factors and Comorbidities Age;Profession    Examination-Activity Limitations Lift;Stand;Locomotion Level;Transfers;Squat;Stairs;Carry;Bend    Examination-Participation Restrictions Community Activity;Occupation;Other;Driving;Shop    Stability/Clinical Decision Making Stable/Uncomplicated    Rehab Potential Good    PT Frequency 2x / week    PT Duration 6 weeks    PT Treatment/Interventions ADLs/Self Care Home Management;Aquatic Therapy;Cryotherapy;Electrical Stimulation;Iontophoresis 4mg /ml Dexamethasone;Moist Heat;Traction;Ultrasound;Gait training;Stair training;Functional mobility training;Therapeutic activities;Therapeutic exercise;Balance training;Neuromuscular re-education;Patient/family education;Orthotic Fit/Training;Scar mobilization;Passive range of motion;Dry needling;Taping;Vasopneumatic Device;Joint Manipulations;Spinal Manipulations    PT Next  Visit Plan STM PRN, review HEP, banded TKE, sidestep, STS    PT Home Exercise Plan [phone]    Consulted and Agree with Plan of Care Patient             Patient will benefit from skilled therapeutic intervention in order to improve the following deficits and impairments:  Abnormal gait, Pain, Improper body mechanics, Decreased mobility, Decreased activity tolerance, Decreased range of motion, Decreased strength, Hypomobility, Impaired flexibility, Increased edema,  Difficulty walking, Decreased balance  Visit Diagnosis: Pain in joint of right knee  Difficulty walking  Muscle weakness (generalized)     Problem List Patient Active Problem List   Diagnosis Date Noted   Cough 10/31/2018   Dyslipidemia 03/03/2016   Vitamin D deficiency 03/03/2016   Daleen Bo PT, DPT 07/22/20 2:30 PM   [facility] [address] [place], [place], [address] Phone: [phone]   Fax:  [phone]  Name: Kathleen Douglas MRN: [id number] Date of Birth: 01-07-1951

## 2020-07-29 ENCOUNTER — Ambulatory Visit (INDEPENDENT_AMBULATORY_CARE_PROVIDER_SITE_OTHER): Payer: Medicare Other | Admitting: Physical Therapy

## 2020-07-29 ENCOUNTER — Encounter: Payer: Self-pay | Admitting: Physical Therapy

## 2020-07-29 ENCOUNTER — Other Ambulatory Visit: Payer: Self-pay

## 2020-07-29 DIAGNOSIS — M25561 Pain in right knee: Secondary | ICD-10-CM | POA: Diagnosis not present

## 2020-07-29 DIAGNOSIS — R262 Difficulty in walking, not elsewhere classified: Secondary | ICD-10-CM

## 2020-07-29 DIAGNOSIS — M6281 Muscle weakness (generalized): Secondary | ICD-10-CM | POA: Diagnosis not present

## 2020-07-29 NOTE — Patient Instructions (Signed)
Access Code: [email or web address] URL: [email or web address] Date: 07/29/2020 Prepared by: Daleen Bo  Exercises Supine Quad Set - 2 x daily - 7 x weekly - 2 sets - 10 reps - 3 hold Seated Hamstring Stretch - 2 x daily - 7 x weekly - 1 sets - 3 reps - 30 hold Sidelying Quadriceps Stretch with Strap - 2 x daily - 7 x weekly - 1 sets - 3 reps - 30 hold Sitting Heel Slide with Towel - 2 x daily - 7 x weekly - 2 sets - 10 reps - 5 hold Supine Bridge - 2 x daily - 7 x weekly - 2 sets - 10 reps Sit to Stand Without Arm Support - 2 x daily - 7 x weekly - 2 sets - 10 reps

## 2020-07-29 NOTE — Therapy (Signed)
[facility] [address] [place], [place], [address] Phone: [phone]   Fax:  [phone]  Physical Therapy Treatment  Patient Details  Name: Kathleen Douglas MRN: [id number] Date of Birth: [birth year] Referring Provider (PT): Dr. Basil Dess   Encounter Date: 07/29/2020   PT End of Session - 07/29/20 1416     Visit Number 3    Number of Visits 13    Date for PT Re-Evaluation 10/06/20    Authorization Type Medicare    PT Start Time 1400   Pt session started late. Pt did not check-in with front desk.   PT Stop Time 1430    PT Time Calculation (min) 30 min    Activity Tolerance Patient tolerated treatment well;No increased pain    Behavior During Therapy [facility] for tasks assessed/performed             Past Medical History:  Diagnosis Date   Cough 10/31/2018   Osteopenia    Vitamin D deficiency     Past Surgical History:  Procedure Laterality Date   ABDOMINAL HYSTERECTOMY     BREAST BIOPSY Left    Excisional    COLONOSCOPY  2004   Dr.Martin Johnson   TUBAL LIGATION      There were no vitals filed for this visit.   Subjective Assessment - 07/29/20 1404     Subjective Pt states that the knee feels better and is moving much better than before. She states that the quad stretch is difficult due to getting off the floor.    Limitations Sitting;House hold activities;Lifting;Standing;Walking    Patient Stated Goals She wants to stop the pain and swelling and go back to normal activity. Wants to walk with heels during daughter's wedding.    Currently in Pain? Yes    Pain Score 1     Pain Location Knee    Pain Orientation Right    Pain Descriptors / Indicators Sore                               [facility] Adult PT Treatment/Exercise - 07/29/20 0001                                     Exercises   Exercises Knee/Hip      Knee/Hip Exercises: Stretches   Passive Hamstring Stretch Limitations 30s 3x  seated    Quad Stretch Limitations 30s 3x          Knee/Hip Exercises: Standing   Other Standing Knee Exercises STS raised height table 3x10; HS curl no weight 3x10 (pain with resistance)    Other Standing Knee Exercises --      Knee/Hip Exercises: Seated   Long Arc Quad Weight 3 lbs.    Long Arc Quad Limitations 3x10      Knee/Hip Exercises: Supine   Bridges 3 sets;10 reps (GTB at [employer])                                PT Education - 07/29/20 1406     Education Details anatomy, exercise progression, DOMS expectations, muscle firing, HEP update    Person(s) Educated Patient    Methods Explanation;Demonstration;Tactile cues;Verbal cues    Comprehension Verbalized understanding;Returned demonstration;Verbal cues required;Tactile cues required  PT Short Term Goals - 07/08/20 1600       PT SHORT TERM GOAL #1   Title Pt will become independent with HEP in order to demonstrate synthesis of PT education.    Time 2    Period Weeks    Status New      PT SHORT TERM GOAL #2   Title Pt will be able to demonstrate full depth squat to pick up objects from floor in order to demonstrate functional improvement in LE function for self-care, occupation, and house hold duties.    Time 4    Period Weeks    Status New               PT Long Term Goals - 07/08/20 1643       PT LONG TERM GOAL #1   Title Pt will become independent with final HEP in order to demonstrate synthesis of PT education.    Time 8    Period Weeks    Status New      PT LONG TERM GOAL #2   Title Pt will improve FOTO score to  >/=60 in order to demonstrate functional improvement with R LE function      PT LONG TERM GOAL #3   Title Pt will be able to perform 5XSTS in under 12s in order to demonstrate functional improvement above the cut off score for older adults.    Time 8    Period Weeks    Status New      PT LONG TERM GOAL #4   Title Pt will be able to demonstrate/report  ability to stand for extended periods of time without pain in order to demonstrate functional improvement and tolerance to static positioning.    Time 8    Period Weeks    Status New                   Plan - 07/29/20 1417     Clinical Impression Statement Pt able to progress functional L LE strengthening at today's shortened session. Pt continues to have knee stiffness with flexion but is able to perform full TKE without pain. Pt HEP modified to include CKC strengthening. Pt required VC and TC for equal WB and hip hinging with STS. Pt is progressing well but still has deficits with ROM and functional SL strength. Pt would benefit from continued skilled therapy in order to reach goals and maximize functional R LE strength and ROM for full return to PLOF.    Personal Factors and Comorbidities Age;Profession    Examination-Activity Limitations Lift;Stand;Locomotion Level;Transfers;Squat;Stairs;Carry;Bend    Examination-Participation Restrictions Community Activity;Occupation;Other;Driving;Shop    Stability/Clinical Decision Making Stable/Uncomplicated    Rehab Potential Good    PT Frequency 2x / week    PT Duration 6 weeks    PT Treatment/Interventions ADLs/Self Care Home Management;Aquatic Therapy;Cryotherapy;Electrical Stimulation;Iontophoresis 4mg /ml Dexamethasone;Moist Heat;Traction;Ultrasound;Gait training;Stair training;Functional mobility training;Therapeutic activities;Therapeutic exercise;Balance training;Neuromuscular re-education;Patient/family education;Orthotic Fit/Training;Scar mobilization;Passive range of motion;Dry needling;Taping;Vasopneumatic Device;Joint Manipulations;Spinal Manipulations    PT Next Visit Plan STM PRN, review HEP, banded TKE, sidestepping    PT Home Exercise Plan [phone]    Consulted and Agree with Plan of Care Patient             Patient will benefit from skilled therapeutic intervention in order to improve the following deficits and  impairments:  Abnormal gait, Pain, Improper body mechanics, Decreased mobility, Decreased activity tolerance, Decreased range of motion, Decreased strength, Hypomobility, Impaired flexibility, Increased edema, Difficulty walking, Decreased  balance  Visit Diagnosis: Pain in joint of right knee  Difficulty walking  Muscle weakness (generalized)     Problem List Patient Active Problem List   Diagnosis Date Noted   Cough 10/31/2018   Dyslipidemia 03/03/2016   Vitamin D deficiency 03/03/2016    Daleen Bo PT, DPT 07/29/20 3:23 PM   [facility] [address] [place], [place], [address] Phone: [phone]   Fax:  [phone]  Name: Caelin Rosen MRN: [id number] Date of Birth: [birth year]   Access Code: [email or web address] URL: [email or web address] Date: 07/29/2020 Prepared by: Daleen Bo  Exercises Supine Quad Set - 2 x daily - 7 x weekly - 2 sets - 10 reps - 3 hold Seated Hamstring Stretch - 2 x daily - 7 x weekly - 1 sets - 3 reps - 30 hold Sidelying Quadriceps Stretch with Strap - 2 x daily - 7 x weekly - 1 sets - 3 reps - 30 hold Sitting Heel Slide with Towel - 2 x daily - 7 x weekly - 2 sets - 10 reps - 5 hold Supine Bridge - 2 x daily - 7 x weekly - 2 sets - 10 reps Sit to Stand Without Arm Support - 2 x daily - 7 x weekly - 2 sets - 10 reps

## 2020-08-05 ENCOUNTER — Encounter: Payer: Medicare Other | Admitting: Physical Therapy

## 2020-08-12 ENCOUNTER — Other Ambulatory Visit: Payer: Self-pay

## 2020-08-12 ENCOUNTER — Ambulatory Visit (INDEPENDENT_AMBULATORY_CARE_PROVIDER_SITE_OTHER): Payer: Medicare Other | Admitting: Physical Therapy

## 2020-08-12 DIAGNOSIS — M6281 Muscle weakness (generalized): Secondary | ICD-10-CM | POA: Diagnosis not present

## 2020-08-12 DIAGNOSIS — M25561 Pain in right knee: Secondary | ICD-10-CM | POA: Diagnosis not present

## 2020-08-12 DIAGNOSIS — R262 Difficulty in walking, not elsewhere classified: Secondary | ICD-10-CM | POA: Diagnosis not present

## 2020-08-12 NOTE — Therapy (Addendum)
[facility] [address] [place], [place], [address] Phone: [phone]   Fax:  [phone]  Physical Therapy Progress Note/Discharge  Patient Details  Name: Kathleen Douglas MRN: [id number] Date of Birth: 1950-09-11 Referring Provider (PT): Dr. Basil Dess   Encounter Date: 08/12/2020     Past Medical History:  Diagnosis Date   Cough 10/31/2018   Osteopenia    Vitamin D deficiency     Past Surgical History:  Procedure Laterality Date   ABDOMINAL HYSTERECTOMY     BREAST BIOPSY Left    Excisional    COLONOSCOPY  2004   Dr.Martin Johnson   TUBAL LIGATION      There were no vitals filed for this visit.        [facility] PT Assessment - 08/12/20 0001       Assessment   Medical Diagnosis M25.561 (ICD-10-CM) - Right knee pain, unspecified chronicity    Referring Provider (PT) Dr. Basil Dess    Prior Therapy [address]; shoulder      Precautions   Precautions None      Restrictions   Weight Bearing Restrictions No      Balance Screen   Has the patient fallen in the past 6 months No    Has the patient had a decrease in activity level because of a fear of falling?  No    Is the patient reluctant to leave their home because of a fear of falling?  No      Home Ecologist residence      Prior Function   Level of Independence Independent      Cognition   Overall Cognitive Status Within Functional Limits for tasks assessed      Observation/Other Assessments   Focus on Therapeutic Outcomes (FOTO)  71 (96.8%) satisfaction      Functional Tests   Functional tests Squat;Lunges      Squat   Comments decreased depth but equal WB      Lunges   Comments high lunge with table support      Posture/Postural Control   Posture/Postural Control Postural limitations    Postural Limitations Increased thoracic kyphosis;Rounded Shoulders;Weight shift left      AROM   Overall AROM Comments R flexion  129; 1  ext; L knee WNL      PROM   Overall PROM Comments R knee: 137 flexion; 0 deg ext      Strength   Overall Strength Comments L LE WNL; R LE 4+/5 and extension; pain with flexion 4/5      Flexibility   Soft Tissue Assessment /Muscle Length yes    Hamstrings limited in supine 90/90              Palpation   Patella mobility limited in all directions; guarding    Palpation comment hypertonicity and TTP of R biceps femoris      McMurray Test   Findings Negative      other   Findings Negative    Comments Joint line tenderness medially      Transfers   Five time sit to stand comments  11.3s      Ambulation/Gait   Ambulation/Gait Yes    Ambulation Distance (Feet) 35 Feet    Gait Pattern Step-through pattern;Decreased dorsiflexion - right;Decreased dorsiflexion - left  PT Short Term Goals - 10/07/20 1359       PT SHORT TERM GOAL #1   Title Pt will become independent with HEP in order to demonstrate synthesis of PT education.    Time 2    Period Weeks    Status Achieved      PT SHORT TERM GOAL #2   Title Pt will be able to demonstrate full depth squat to pick up objects from floor in order to demonstrate functional improvement in LE function for self-care, occupation, and house hold duties.    Time 4    Period Weeks    Status Achieved               PT Long Term Goals - 10/07/20 1359       PT LONG TERM GOAL #1   Title Pt will become independent with final HEP in order to demonstrate synthesis of PT education.    Time 8    Period Weeks    Status Achieved      PT LONG TERM GOAL #2   Title Pt will improve FOTO score to  >/=60 in order to demonstrate functional improvement with R LE function    Status Achieved      PT LONG TERM GOAL #3   Title Pt will be able to perform 5XSTS in under 12s in order to demonstrate functional improvement above the cut off score for older adults.    Time 8     Period Weeks    Status Achieved      PT LONG TERM GOAL #4   Title Pt will be able to demonstrate/report ability to stand for extended periods of time without pain in order to demonstrate functional improvement and tolerance to static positioning.    Time 8    Period Weeks    Status Achieved                     Patient will benefit from skilled therapeutic intervention in order to improve the following deficits and impairments:  Abnormal gait, Pain, Improper body mechanics, Decreased mobility, Decreased activity tolerance, Decreased range of motion, Decreased strength, Hypomobility, Impaired flexibility, Increased edema, Difficulty walking, Decreased balance  Visit Diagnosis: Pain in joint of right knee  Difficulty walking  Muscle weakness (generalized)     Problem List Patient Active Problem List   Diagnosis Date Noted   Cough 10/31/2018   Dyslipidemia 03/03/2016   Vitamin D deficiency 03/03/2016   Daleen Bo PT, DPT 10/07/20 2:00 PM   [facility] [address] [place], [place], [address] Phone: [phone]   Fax:  [phone]  Name: Kathleen Douglas MRN: [id number] Date of Birth: 1950-09-20   PHYSICAL THERAPY DISCHARGE SUMMARY  Visits from Start of Care: 4   Plan: Patient  previously agreed to discharge.  Patient goals met. Patient is being discharged due to meeting the stated rehab goals. Pt did not return since last visit, suggesting no further complaints.

## 2020-08-15 ENCOUNTER — Telehealth: Payer: Self-pay | Admitting: Physical Therapy

## 2020-08-15 NOTE — Telephone Encounter (Signed)
Pt needs a call. She is freaking out about her appt Tuesday.   CB [phone]

## 2020-08-19 ENCOUNTER — Encounter: Payer: Medicare Other | Admitting: Physical Therapy

## 2020-11-26 ENCOUNTER — Telehealth: Payer: Self-pay | Admitting: Family Medicine

## 2020-11-26 NOTE — Telephone Encounter (Signed)
..  Caller name:  Campbell Stall  On DPR? :yes/no: Yes  Call back [phone]  Provider they see: Carlota Raspberry  Reason for call: Patient has a lump under her arm beneath left breast.  She called [facility] on [address].  They told patient that Dr. Carlota Raspberry would have to order this.  Patient believes she might need an ultrasound also because of the location of the knot.  Please call

## 2020-11-26 NOTE — Telephone Encounter (Signed)
FYI I have scheduled patient an appt for 12/01/20 @ 3:40

## 2020-11-26 NOTE — Telephone Encounter (Signed)
Agree with office visit so we can provide further information for that ultrasound and mammogram.  Keep appointment on the 24th.  If any acute worsening/changes prior to that time can be seen sooner by other provider if needed.  Thanks

## 2020-12-01 ENCOUNTER — Encounter: Payer: Self-pay | Admitting: Family Medicine

## 2020-12-01 ENCOUNTER — Ambulatory Visit (INDEPENDENT_AMBULATORY_CARE_PROVIDER_SITE_OTHER): Payer: Medicare Other | Admitting: Family Medicine

## 2020-12-01 ENCOUNTER — Other Ambulatory Visit: Payer: Self-pay

## 2020-12-01 VITALS — BP 136/78 | HR 78 | Temp 98.1°F | Resp 16 | Ht 63.0 in | Wt 153.0 lb

## 2020-12-01 DIAGNOSIS — Z1231 Encounter for screening mammogram for malignant neoplasm of breast: Secondary | ICD-10-CM

## 2020-12-01 DIAGNOSIS — D1720 Benign lipomatous neoplasm of skin and subcutaneous tissue of unspecified limb: Secondary | ICD-10-CM

## 2020-12-01 DIAGNOSIS — R2232 Localized swelling, mass and lump, left upper limb: Secondary | ICD-10-CM

## 2020-12-01 DIAGNOSIS — N6489 Other specified disorders of breast: Secondary | ICD-10-CM | POA: Diagnosis not present

## 2020-12-01 DIAGNOSIS — Z23 Encounter for immunization: Secondary | ICD-10-CM

## 2020-12-01 DIAGNOSIS — M5412 Radiculopathy, cervical region: Secondary | ICD-10-CM

## 2020-12-01 NOTE — Patient Instructions (Signed)
Will order ultrasound and mammogram for the area of concern in the left armpit.  Heat or ice as needed to spasm in the neck as well as massage, gentle range of motion and stretches.  Follow-up if arm pain/numbness symptoms continue.  I have also placed an order for the ultrasound of your lower leg which is likely a lipoma.  Please follow-up to discuss those areas further in the next month. Return to the clinic or go to the nearest emergency room if any of your symptoms worsen or new symptoms occur.
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
## 2020-12-02 ENCOUNTER — Other Ambulatory Visit: Payer: Self-pay | Admitting: Family Medicine

## 2020-12-03 NOTE — Addendum Note (Signed)
Addended by: Merri Ray R on: 12/03/2020 01:12 PM   Modules accepted: Orders

## 2020-12-05 NOTE — Addendum Note (Signed)
Addended by: Merri Ray R on: 12/05/2020 02:57 PM   Modules accepted: Orders

## 2020-12-10 ENCOUNTER — Ambulatory Visit
Admission: RE | Admit: 2020-12-10 | Discharge: 2020-12-10 | Disposition: A | Discharge status: Home / Self Care | Payer: Medicare Other | Source: Ambulatory Visit | Attending: Family Medicine | Admitting: Family Medicine

## 2020-12-10 DIAGNOSIS — D1724 Benign lipomatous neoplasm of skin and subcutaneous tissue of left leg: Secondary | ICD-10-CM | POA: Diagnosis not present

## 2020-12-10 DIAGNOSIS — D1720 Benign lipomatous neoplasm of skin and subcutaneous tissue of unspecified limb: Secondary | ICD-10-CM

## 2020-12-11 ENCOUNTER — Ambulatory Visit
Admission: RE | Admit: 2020-12-11 | Discharge: 2020-12-11 | Disposition: A | Discharge status: Home / Self Care | Payer: Medicare Other | Source: Ambulatory Visit | Attending: Family Medicine | Admitting: Family Medicine

## 2020-12-11 DIAGNOSIS — D1720 Benign lipomatous neoplasm of skin and subcutaneous tissue of unspecified limb: Secondary | ICD-10-CM

## 2020-12-11 DIAGNOSIS — M25572 Pain in left ankle and joints of left foot: Secondary | ICD-10-CM | POA: Diagnosis not present

## 2020-12-18 ENCOUNTER — Other Ambulatory Visit: Payer: Self-pay

## 2020-12-18 ENCOUNTER — Ambulatory Visit
Admission: RE | Admit: 2020-12-18 | Discharge: 2020-12-18 | Disposition: A | Discharge status: Home / Self Care | Payer: Medicare Other | Source: Ambulatory Visit | Attending: Family Medicine | Admitting: Family Medicine

## 2020-12-18 DIAGNOSIS — R922 Inconclusive mammogram: Secondary | ICD-10-CM | POA: Diagnosis not present

## 2020-12-18 DIAGNOSIS — Z1231 Encounter for screening mammogram for malignant neoplasm of breast: Secondary | ICD-10-CM

## 2020-12-18 DIAGNOSIS — R2232 Localized swelling, mass and lump, left upper limb: Secondary | ICD-10-CM

## 2020-12-18 DIAGNOSIS — N6489 Other specified disorders of breast: Secondary | ICD-10-CM

## 2021-01-05 ENCOUNTER — Ambulatory Visit (INDEPENDENT_AMBULATORY_CARE_PROVIDER_SITE_OTHER): Payer: Medicare Other | Admitting: Family Medicine

## 2021-01-05 ENCOUNTER — Other Ambulatory Visit: Payer: Self-pay

## 2021-01-05 ENCOUNTER — Encounter: Payer: Self-pay | Admitting: Family Medicine

## 2021-01-05 VITALS — BP 122/80 | HR 81 | Temp 98.4°F | Resp 16 | Ht 63.0 in | Wt 155.4 lb

## 2021-01-05 DIAGNOSIS — M7582 Other shoulder lesions, left shoulder: Secondary | ICD-10-CM | POA: Diagnosis not present

## 2021-01-05 DIAGNOSIS — M5412 Radiculopathy, cervical region: Secondary | ICD-10-CM
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
## 2021-01-05 NOTE — Patient Instructions (Signed)
I am glad to hear that you are improving.  Try the pectoralis muscle stretch as we discussed in addition to your other stretches and range of motion.  If symptoms or not continue to improve, let me know and I can certainly have you see an orthopedist to evaluate for possible physical therapy or other treatment

## 2021-01-23 ENCOUNTER — Ambulatory Visit: Payer: Self-pay

## 2021-01-23 ENCOUNTER — Ambulatory Visit (INDEPENDENT_AMBULATORY_CARE_PROVIDER_SITE_OTHER): Payer: Medicare Other | Admitting: Specialist

## 2021-01-23 ENCOUNTER — Other Ambulatory Visit: Payer: Self-pay

## 2021-01-23 ENCOUNTER — Encounter: Payer: Self-pay | Admitting: Specialist

## 2021-01-23 VITALS — BP 158/90 | HR 66 | Ht 63.0 in | Wt 155.5 lb

## 2021-01-23 DIAGNOSIS — M542 Cervicalgia: Secondary | ICD-10-CM | POA: Diagnosis not present

## 2021-01-23 DIAGNOSIS — R200 Anesthesia of skin: Secondary | ICD-10-CM | POA: Diagnosis not present

## 2021-01-23 DIAGNOSIS — M25512 Pain in left shoulder: Secondary | ICD-10-CM | POA: Diagnosis not present

## 2021-01-23 DIAGNOSIS — M4722 Other spondylosis with radiculopathy, cervical region: Secondary | ICD-10-CM

## 2021-01-23 DIAGNOSIS — R202 Paresthesia of skin: Secondary | ICD-10-CM

## 2021-01-23 DIAGNOSIS — M7582 Other shoulder lesions, left shoulder: Secondary | ICD-10-CM | POA: Diagnosis not present

## 2021-01-23 MED ORDER — BUPIVACAINE HCL 0.5 % IJ SOLN
5.0000 mL | INTRAMUSCULAR | Status: AC | PRN
  Administered 2021-01-23: 5 mL via INTRA_ARTICULAR

## 2021-01-23 MED ORDER — METHYLPREDNISOLONE 4 MG PO TBPK: ORAL_TABLET | ORAL | 0 refills | Status: DC

## 2021-01-23 MED ORDER — TRAMADOL-ACETAMINOPHEN 37.5-325 MG PO TABS: 1.0000 | ORAL_TABLET | Freq: Four times a day (QID) | ORAL | 0 refills | Status: DC | PRN

## 2021-01-23 MED ORDER — METHYLPREDNISOLONE ACETATE 40 MG/ML IJ SUSP
40.0000 mg | INTRAMUSCULAR | Status: AC | PRN
  Administered 2021-01-23: 40 mg via INTRA_ARTICULAR
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
## 2021-01-23 NOTE — Patient Instructions (Addendum)
Avoid overhead lifting and overhead use of the arms. Pillows to keep from sleeping directly on the shoulders Limited lifting to less than 10 lbs. Ice or heat for relief. NSAIDs are helpful, such as alleve or motrin, be careful not to use in excess as they place burdens on the kidney. While first taking medrol dose pak avoid arthritis meds then after one -two days post dose pak resume. Stretching exercise help and strengthening is helpful to build endurance.  Medrol dose pak Physical therapy left shoulder and cervical spine.

## 2021-02-05 ENCOUNTER — Encounter: Payer: Self-pay | Admitting: Family Medicine

## 2021-02-05 ENCOUNTER — Telehealth (INDEPENDENT_AMBULATORY_CARE_PROVIDER_SITE_OTHER): Payer: Medicare Other | Admitting: Family Medicine

## 2021-02-05 VITALS — Wt 157.6 lb

## 2021-02-05 DIAGNOSIS — M5412 Radiculopathy, cervical region: Secondary | ICD-10-CM

## 2021-02-05 DIAGNOSIS — M7582 Other shoulder lesions, left shoulder: Secondary | ICD-10-CM

## 2021-02-05 DIAGNOSIS — D1720 Benign lipomatous neoplasm of skin and subcutaneous tissue of unspecified limb: Secondary | ICD-10-CM

## 2021-02-05 NOTE — Patient Instructions (Signed)
c 
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
## 2021-02-13 ENCOUNTER — Encounter: Payer: Self-pay | Admitting: Physical Medicine and Rehabilitation

## 2021-02-13 ENCOUNTER — Ambulatory Visit (INDEPENDENT_AMBULATORY_CARE_PROVIDER_SITE_OTHER): Payer: Medicare Other | Admitting: Physical Medicine and Rehabilitation

## 2021-02-13 ENCOUNTER — Ambulatory Visit: Payer: Medicare Other | Admitting: Specialist

## 2021-02-13 ENCOUNTER — Other Ambulatory Visit: Payer: Self-pay

## 2021-02-13 DIAGNOSIS — R202 Paresthesia of skin: Secondary | ICD-10-CM | POA: Diagnosis not present
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
## 2021-02-17 ENCOUNTER — Ambulatory Visit: Payer: Medicare Other | Admitting: Physical Therapy

## 2021-02-19 NOTE — Procedures (Signed)
EMG & NCV Findings: Evaluation of the left median (across palm) sensory nerve showed no response (Palm) and prolonged distal peak latency (4.0 ms).  The left ulnar sensory nerve showed prolonged distal peak latency (3.8 ms) and decreased conduction velocity (Wrist-5th Digit, 37 m/s).  All remaining nerves (as indicated in the following tables) were within normal limits.    All examined muscles (as indicated in the following table) showed no evidence of electrical instability.    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   There is no significant electrodiagnostic evidence of any other focal nerve entrapment, brachial plexopathy or cervical radiculopathy in the left upper limb.  As you know, this particular electrodiagnostic study cannot rule out chemical radiculitis or sensory only radiculopathy.  Recommendations: 1.  Follow-up with referring physician. 2.  Continue current management of symptoms. 3.  Continue use of resting splint at night-time and as needed during the day.  ___________________________ Wonda Olds Board Certified, American Board of Physical Medicine and Rehabilitation    Nerve Conduction Studies Anti Sensory Summary Table   Stim Site NR Peak (ms) Norm Peak (ms) P-T Amp (V) Norm P-T Amp Site1 Site2 Delta-P (ms) Dist (cm) Vel (m/s) Norm Vel (m/s)  Left Median Acr Palm Anti Sensory (2nd Digit)  29.9C  Wrist    *4.0 <3.6 23.0 >10 Wrist Palm  0.0    Palm *NR  <2.0          Left Radial Anti Sensory (Base 1st Digit)  29.3C  Wrist    2.4 <3.1 22.7  Wrist Base 1st Digit 2.4 0.0    Left Ulnar Anti Sensory (5th Digit)  30C  Wrist    *3.8 <3.7 28.9 >15.0 Wrist 5th Digit 3.8 14.0 *37 >38   Motor Summary Table   Stim Site NR Onset (ms) Norm Onset (ms) O-P Amp (mV) Norm O-P Amp Site1 Site2 Delta-0 (ms) Dist (cm) Vel (m/s) Norm  Vel (m/s)  Left Median Motor (Abd Poll Brev)  29.9C  Wrist    3.8 <4.2 9.7 >5 Elbow Wrist 4.3 22.5 52 >50  Elbow    8.1  9.4         Left Ulnar Motor (Abd Dig Min)  30C  Wrist    3.4 <4.2 9.0 >3 B Elbow Wrist 3.8 20.0 53 >53  B Elbow    7.2  7.5  A Elbow B Elbow 1.7 10.0 59 >53  A Elbow    8.9  8.1          EMG   Side Muscle Nerve Root Ins Act Fibs Psw Amp Dur Poly Recrt Int Fraser Din Comment  Left 1stDorInt Ulnar C8-T1 Nml Nml Nml Nml Nml 0 Nml Nml   Left Abd Poll Brev Median C8-T1 Nml Nml Nml Nml Nml 0 Nml Nml   Left ExtDigCom   Nml Nml Nml Nml Nml 0 Nml Nml   Left Triceps Radial C6-7-8 Nml Nml Nml Nml Nml 0 Nml Nml   Left Deltoid Axillary C5-6 Nml Nml Nml Nml Nml 0 Nml Nml     Nerve Conduction Studies Anti Sensory Left/Right Comparison   Stim Site L Lat (ms) R Lat (ms) L-R Lat (ms) L Amp (V) R Amp (V) L-R Amp (%) Site1 Site2 L Vel (m/s) R Vel (m/s) L-R Vel (m/s)  Median Acr Palm Anti Sensory (2nd Digit)  29.9C  Wrist *4.0  23.0   Wrist Palm     Palm             Radial Anti Sensory (Base 1st Digit)  29.3C  Wrist 2.4   22.7   Wrist Base 1st Digit     Ulnar Anti Sensory (5th Digit)  30C  Wrist *3.8   28.9   Wrist 5th Digit *37     Motor Left/Right Comparison   Stim Site L Lat (ms) R Lat (ms) L-R Lat (ms) L Amp (mV) R Amp (mV) L-R Amp (%) Site1 Site2 L Vel (m/s) R Vel (m/s) L-R Vel (m/s)  Median Motor (Abd Poll Brev)  29.9C  Wrist 3.8   9.7   Elbow Wrist 52    Elbow 8.1   9.4         Ulnar Motor (Abd Dig Min)  30C  Wrist 3.4   9.0   B Elbow Wrist 53    B Elbow 7.2   7.5   A Elbow B Elbow 59    A Elbow 8.9   8.1            Waveforms:

## 2021-02-19 NOTE — Progress Notes (Signed)
Kathleen Douglas - 71 y.o. female MRN [id number]  Date of birth: August 01, 1950  Office Visit Note: Visit Date: 02/13/2021 PCP: Wendie Agreste, MD Referred by: Jessy Oto, MD  Subjective: Chief Complaint  Patient presents with   Neck - Pain   Left Shoulder - Pain   Left Arm - Pain   Left Hand - Pain   HPI:  Kathleen Douglas is a 71 y.o. female who comes in today at the request of Dr. Basil Dess for electrodiagnostic study of the Left upper extremities.  Patient is Right hand dominant.  Dr. Otho Ket referral questions C6 or C7 radiculopathy versus carpal tunnel syndrome or median neuropathy.  Patient describes 1 out of 10 pain but she does report pain from the neck and left shoulder and arm and hand.  She reports the shoulder hurts more when she lifts her arm and seems to be mechanical.  She does take over-the-counter medications and heat and ice and this helps to some degree.  She reports that shoulder injection performed on 01/13/2021 gave her quite a bit of relief and she is not in much pain today.  She reports that it was affecting her daily living quite a bit.  She denies any real frank paresthesias today.  No prior electrodiagnostic studies.  ROS Otherwise per HPI.  Assessment & Plan: Visit Diagnoses:    ICD-10-CM   1. 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   There is no significant electrodiagnostic evidence of any other focal nerve entrapment, brachial plexopathy or cervical radiculopathy in the left upper limb.  As you know, this particular electrodiagnostic study cannot rule out chemical radiculitis or sensory only radiculopathy.  Recommendations: 1.  Follow-up with referring physician. 2.  Continue current management of symptoms. 3.  Continue use of  resting splint at night-time and as needed during the day.  Meds & Orders: No orders of the defined types were placed in this encounter.   Orders Placed This Encounter  Procedures   NCV with EMG (electromyography)    Follow-up: Return in about 2 weeks (around 02/27/2021) for Basil Dess, MD.   Procedures: No procedures performed  EMG & NCV Findings: Evaluation of the left median (across palm) sensory nerve showed no response (Palm) and prolonged distal peak latency (4.0 ms).  The left ulnar sensory nerve showed prolonged distal peak latency (3.8 ms) and decreased conduction velocity (Wrist-5th Digit, 37 m/s).  All remaining nerves (as indicated in the following tables) were within normal limits.    All examined muscles (as indicated in the following table) showed no evidence of electrical instability.    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   There is no significant electrodiagnostic evidence of any other focal nerve entrapment, brachial plexopathy or cervical radiculopathy in the left upper limb.  As you know, this particular electrodiagnostic study cannot rule out chemical radiculitis or sensory only radiculopathy.  Recommendations: 1.  Follow-up with referring physician. 2.  Continue current management of symptoms. 3.  Continue use of resting splint at night-time and as needed during the day.  ___________________________ [place] Certified, American Board of Physical Medicine and Rehabilitation    Nerve  Conduction Studies Anti Sensory Summary Table   Stim Site NR Peak (ms) Norm Peak (ms) P-T Amp (V) Norm P-T Amp Site1 Site2 Delta-P (ms) Dist (cm) Vel (m/s) Norm Vel (m/s)  Left Median Acr Palm Anti Sensory (2nd Digit)  29.9C  Wrist    *4.0 <3.6 23.0 >10 Wrist Palm  0.0    Palm *NR  <2.0          Left Radial Anti  Sensory (Base 1st Digit)  29.3C  Wrist    2.4 <3.1 22.7  Wrist Base 1st Digit 2.4 0.0    Left Ulnar Anti Sensory (5th Digit)  30C  Wrist    *3.8 <3.7 28.9 >15.0 Wrist 5th Digit 3.8 14.0 *37 >38   Motor Summary Table   Stim Site NR Onset (ms) Norm Onset (ms) O-P Amp (mV) Norm O-P Amp Site1 Site2 Delta-0 (ms) Dist (cm) Vel (m/s) Norm Vel (m/s)  Left Median Motor (Abd Poll Brev)  29.9C  Wrist    3.8 <4.2 9.7 >5 Elbow Wrist 4.3 22.5 52 >50  Elbow    8.1  9.4         Left Ulnar Motor (Abd Dig Min)  30C  Wrist    3.4 <4.2 9.0 >3 B Elbow Wrist 3.8 20.0 53 >53  B Elbow    7.2  7.5  A Elbow B Elbow 1.7 10.0 59 >53  A Elbow    8.9  8.1          EMG   Side Muscle Nerve Root Ins Act Fibs Psw Amp Dur Poly Recrt Int Fraser Din Comment  Left 1stDorInt Ulnar C8-T1 Nml Nml Nml Nml Nml 0 Nml Nml   Left Abd Poll Brev Median C8-T1 Nml Nml Nml Nml Nml 0 Nml Nml   Left ExtDigCom   Nml Nml Nml Nml Nml 0 Nml Nml   Left Triceps Radial C6-7-8 Nml Nml Nml Nml Nml 0 Nml Nml   Left Deltoid Axillary C5-6 Nml Nml Nml Nml Nml 0 Nml Nml     Nerve Conduction Studies Anti Sensory Left/Right Comparison   Stim Site L Lat (ms) R Lat (ms) L-R Lat (ms) L Amp (V) R Amp (V) L-R Amp (%) Site1 Site2 L Vel (m/s) R Vel (m/s) L-R Vel (m/s)  Median Acr Palm Anti Sensory (2nd Digit)  29.9C  Wrist *4.0   23.0   Wrist Palm     Palm             Radial Anti Sensory (Base 1st Digit)  29.3C  Wrist 2.4   22.7   Wrist Base 1st Digit     Ulnar Anti Sensory (5th Digit)  30C  Wrist *3.8   28.9   Wrist 5th Digit *37     Motor Left/Right Comparison   Stim Site L Lat (ms) R Lat (ms) L-R Lat (ms) L Amp (mV) R Amp (mV) L-R Amp (%) Site1 Site2 L Vel (m/s) R Vel (m/s) L-R Vel (m/s)  Median Motor (Abd Poll Brev)  29.9C  Wrist 3.8   9.7   Elbow Wrist 52    Elbow 8.1   9.4         Ulnar Motor (Abd Dig Min)  30C  Wrist 3.4   9.0   B Elbow Wrist 53    B Elbow 7.2   7.5   A Elbow B Elbow 59    A Elbow 8.9   8.1  Waveforms:             Clinical History: No specialty comments available.     Objective:  VS:  HT:     WT:    BMI:      BP:    HR: bpm   TEMP: ( )   RESP:  Physical Exam Musculoskeletal:        General: No swelling, tenderness or deformity.     Comments: Inspection reveals no atrophy of the bilateral APB or FDI or hand intrinsics. There is no swelling, color changes, allodynia or dystrophic changes. There is 5 out of 5 strength in the bilateral wrist extension, finger abduction and long finger flexion. There is intact sensation to light touch in all dermatomal and peripheral nerve distributions. There is a negative Phalen's test bilaterally. There is a negative Hoffmann's test bilaterally.  Skin:    General: Skin is warm and dry.     Findings: No erythema or rash.  Neurological:     General: No focal deficit present.     Mental Status: She is alert and oriented to person, place, and time.     Motor: No weakness or abnormal muscle tone.     Coordination: Coordination normal.  Psychiatric:        Mood and Affect: Mood normal.        Behavior: Behavior normal.     Imaging: No results found.

## 2021-02-24 ENCOUNTER — Other Ambulatory Visit: Payer: Self-pay

## 2021-02-24 ENCOUNTER — Encounter: Payer: Self-pay | Admitting: Physical Therapy

## 2021-02-24 ENCOUNTER — Ambulatory Visit (INDEPENDENT_AMBULATORY_CARE_PROVIDER_SITE_OTHER): Payer: Medicare Other | Admitting: Physical Therapy

## 2021-02-24 DIAGNOSIS — M542 Cervicalgia: Secondary | ICD-10-CM | POA: Diagnosis not present

## 2021-02-24 DIAGNOSIS — M6281 Muscle weakness (generalized): Secondary | ICD-10-CM

## 2021-02-24 DIAGNOSIS — M25512 Pain in left shoulder: Secondary | ICD-10-CM | POA: Diagnosis not present

## 2021-02-24 DIAGNOSIS — M25612 Stiffness of left shoulder, not elsewhere classified: Secondary | ICD-10-CM | POA: Diagnosis not present

## 2021-02-24 NOTE — Patient Instructions (Signed)
Access Code: [email or web address] URL: [email or web address] Date: 02/24/2021 Prepared by: Kearney Hard  Exercises Shoulder Flexion Wall Slide with Towel - 2 x daily - 7 x weekly - 10 reps Supine Shoulder Flexion Extension AAROM with Dowel - 2 x daily - 7 x weekly - 10 reps - 3-5 seconds hold Standing Shoulder Row with Anchored Resistance - 2 x daily - 7 x weekly - 2 sets - 10 reps - 3 seconds hold Supine Shoulder External Rotation in 45 Degrees Abduction AAROM with Dowel - 2 x daily - 7 x weekly - 10 reps - 3-5 seconds hold Shoulder Scaption Wall Slide with Towel - 2 x daily - 7 x weekly - 10 reps Seated Gentle Upper Trapezius Stretch - 2 x daily - 7 x weekly - 3 reps - 10 seconds hold
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
## 2021-02-27 ENCOUNTER — Ambulatory Visit (INDEPENDENT_AMBULATORY_CARE_PROVIDER_SITE_OTHER): Payer: Medicare Other | Admitting: Specialist

## 2021-02-27 ENCOUNTER — Other Ambulatory Visit: Payer: Self-pay

## 2021-02-27 ENCOUNTER — Encounter: Payer: Self-pay | Admitting: Specialist

## 2021-02-27 VITALS — BP 147/87 | HR 92 | Ht 63.0 in | Wt 155.5 lb

## 2021-02-27 DIAGNOSIS — R202 Paresthesia of skin: Secondary | ICD-10-CM

## 2021-02-27 DIAGNOSIS — M7582 Other shoulder lesions, left shoulder: Secondary | ICD-10-CM | POA: Diagnosis not present

## 2021-02-27 DIAGNOSIS — R200 Anesthesia of skin: Secondary | ICD-10-CM | POA: Diagnosis not present

## 2021-02-27 DIAGNOSIS — M25512 Pain in left shoulder: Secondary | ICD-10-CM | POA: Diagnosis not present

## 2021-02-27 DIAGNOSIS — G5602 Carpal tunnel syndrome, left upper limb: Secondary | ICD-10-CM | POA: Diagnosis not present

## 2021-02-27 NOTE — Patient Instructions (Addendum)
Plan: Avoid overhead lifting and overhead use of the arms. Pillows to keep from sleeping directly on the shoulders Limited lifting to less than 10 lbs. Ice or heat for relief. NSAIDs are helpful, such as alleve or motrin, be careful not to use in excess as they place burdens on the kidney. Stretching exercise help and strengthening is helpful to build endurance.  PT for shoulder and neck. Carpal Tunnel Syndrome  Carpal tunnel syndrome is a condition that causes pain in your hand and arm. The carpal tunnel is a narrow area located on the palm side of your wrist. Repeated wrist motion or certain diseases may cause swelling within the tunnel. This swelling pinches the main nerve in the wrist (median nerve). What are the causes? This condition may be caused by: Repeated wrist motions. Wrist injuries. Arthritis. A cyst or tumor in the carpal tunnel. Fluid buildup during pregnancy. Sometimes the cause of this condition is not known. What increases the risk? This condition is more likely to develop in: People who have jobs that cause them to repeatedly move their wrists in the same motion, such as Art gallery manager. Women. People with certain conditions, such as: Diabetes. Obesity. An underactive thyroid (hypothyroidism). Kidney failure. What are the signs or symptoms? Symptoms of this condition include: A tingling feeling in your fingers, especially in your thumb, index, and middle fingers. Tingling or numbness in your hand. An aching feeling in your entire arm, especially when your wrist and elbow are bent for long periods of time. Wrist pain that goes up your arm to your shoulder. Pain that goes down into your palm or fingers. A weak feeling in your hands. You may have trouble grabbing and holding items. Your symptoms may feel worse during the night. How is this diagnosed? This condition is diagnosed with a medical history and physical exam. You may also have tests,  including: An electromyogram (EMG). This test measures electrical signals sent by your nerves into the muscles. X-rays. How is this treated? Treatment for this condition includes: Lifestyle changes. It is important to stop doing or modify the activity that caused your condition. Physical or occupational therapy. Medicines for pain and inflammation. This may include medicine that is injected into your wrist. A wrist splint. Surgery. Follow these instructions at home: If you have a splint:  Wear it as told by your health care provider. Remove it only as told by your health care provider. Loosen the splint if your fingers become numb and tingle, or if they turn cold and blue. Keep the splint clean and dry. General instructions  Take over-the-counter and prescription medicines only as told by your health care provider. Rest your wrist from any activity that may be causing your pain. If your condition is work related, talk to your employer about changes that can be made, such as getting a wrist pad to use while typing. If directed, apply ice to the painful area: Put ice in a plastic bag. Place a towel between your skin and the bag. Leave the ice on for 20 minutes, 2-3 times per day. Keep all follow-up visits as told by your health care provider. This is important. Do any exercises as told by your health care provider, physical therapist, or occupational therapist. Contact a health care provider if: You have new symptoms. Your pain is not controlled with medicines. Your symptoms get worse. This information is not intended to replace advice given to you by your health care provider. Make sure you discuss any  questions you have with your health care provider. Document Released: 01/23/2000 Document Revised: 06/05/2015 Document Reviewed: 10/06/2016 Elsevier Interactive Patient Education  2017 [employer].

## 2021-02-27 NOTE — Progress Notes (Signed)
Office Visit Note   Patient: Kathleen Douglas           Date of Birth: [birth year]           MRN: [id number] Visit Date: 02/27/2021              Requested by: Wendie Agreste, MD [address] [place],  [place] [address] PCP: Wendie Agreste, MD   Assessment & Plan: Visit Diagnoses:  1. Rotator cuff tendonitis, left   2. Left shoulder pain, unspecified chronicity   3. Numbness and tingling in left arm   4. Carpal tunnel syndrome, left upper limb     Plan: Avoid overhead lifting and overhead use of the arms. Pillows to keep from sleeping directly on the shoulders Limited lifting to less than 10 lbs. Ice or heat for relief. NSAIDs are helpful, such as alleve or motrin, be careful not to use in excess as they place burdens on the kidney.   Stretching exercise help and strengthening is helpful to build endurance.      Follow-Up Instructions: No follow-ups on file.   Orders:  No orders of the defined types were placed in this encounter.  No orders of the defined types were placed in this encounter.     Procedures: No procedures performed   Clinical Data: No additional findings.   Subjective: Chief Complaint  Patient presents with   Neck - Follow-up    EMG/NCS review    HPI  Review of Systems   Objective: Vital Signs: BP (!) 147/87 (BP Location: Left Arm, Patient Position: Sitting)    Pulse 92    Ht 5\' 3"  (1.6 m)    Wt 155 lb 8 oz (70.5 kg)    BMI 27.55 kg/m   Physical Exam Constitutional:      Appearance: She is well-developed.  HENT:     Head: Normocephalic and atraumatic.  Eyes:     Pupils: Pupils are equal, round, and reactive to light.  Pulmonary:     Effort: Pulmonary effort is normal.     Breath sounds: Normal breath sounds.  Abdominal:     General: Bowel sounds are normal.     Palpations: Abdomen is soft.  Musculoskeletal:        General: Normal range of motion.     Cervical back: Normal range of motion and neck supple.   Skin:    General: Skin is warm and dry.  Neurological:     Mental Status: She is alert and oriented to person, place, and time.  Psychiatric:        Behavior: Behavior normal.        Thought Content: Thought content normal.        Judgment: Judgment normal.    Ortho Exam  Specialty Comments:  No specialty comments available.  Imaging: No results found.   PMFS History: Patient Active Problem List   Diagnosis Date Noted   Cough 10/31/2018   Dyslipidemia 03/03/2016   Vitamin D deficiency 03/03/2016   Past Medical History:  Diagnosis Date   Cough 10/31/2018   Osteopenia    Vitamin D deficiency     Family History  Problem Relation Age of Onset   Lymphoma Mother    Cancer Father    Hypertension Sister    Hypertension Daughter    Cancer Maternal Grandmother    Diabetes Maternal Grandfather    Cancer Paternal Grandmother    Colon cancer Neg Hx    Esophageal  cancer Neg Hx    Rectal cancer Neg Hx    Stomach cancer Neg Hx    Colon polyps Neg Hx     Past Surgical History:  Procedure Laterality Date   ABDOMINAL HYSTERECTOMY     BREAST BIOPSY Left    Excisional    COLONOSCOPY  2004   [place]   TUBAL LIGATION     Social History   Occupational History   Not on file  Tobacco Use   Smoking status: Never   Smokeless tobacco: Never  Vaping Use   Vaping Use: Never used  Substance and Sexual Activity   Alcohol use: Yes    Comment: socially, occ.   Drug use: No   Sexual activity: Yes    Birth control/protection: None

## 2021-03-10 ENCOUNTER — Ambulatory Visit (INDEPENDENT_AMBULATORY_CARE_PROVIDER_SITE_OTHER): Payer: Medicare Other | Admitting: Physical Therapy

## 2021-03-10 ENCOUNTER — Encounter: Payer: Self-pay | Admitting: Physical Therapy

## 2021-03-10 ENCOUNTER — Other Ambulatory Visit: Payer: Self-pay

## 2021-03-10 DIAGNOSIS — M542 Cervicalgia: Secondary | ICD-10-CM

## 2021-03-10 DIAGNOSIS — M25561 Pain in right knee: Secondary | ICD-10-CM | POA: Diagnosis not present

## 2021-03-10 DIAGNOSIS — M25612 Stiffness of left shoulder, not elsewhere classified: Secondary | ICD-10-CM | POA: Diagnosis not present

## 2021-03-10 DIAGNOSIS — M25512 Pain in left shoulder: Secondary | ICD-10-CM

## 2021-03-10 DIAGNOSIS — R262 Difficulty in walking, not elsewhere classified: Secondary | ICD-10-CM | POA: Diagnosis not present

## 2021-03-10 DIAGNOSIS — M6281 Muscle weakness (generalized): Secondary | ICD-10-CM | POA: Diagnosis not present
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
## 2021-03-17 ENCOUNTER — Encounter: Payer: Medicare Other | Admitting: Physical Therapy

## 2021-03-24 ENCOUNTER — Encounter: Payer: Self-pay | Admitting: Physical Therapy

## 2021-03-24 ENCOUNTER — Other Ambulatory Visit: Payer: Self-pay

## 2021-03-24 ENCOUNTER — Ambulatory Visit (INDEPENDENT_AMBULATORY_CARE_PROVIDER_SITE_OTHER): Payer: Medicare Other | Admitting: Physical Therapy

## 2021-03-24 DIAGNOSIS — M6281 Muscle weakness (generalized): Secondary | ICD-10-CM | POA: Diagnosis not present

## 2021-03-24 DIAGNOSIS — M542 Cervicalgia: Secondary | ICD-10-CM | POA: Diagnosis not present

## 2021-03-24 DIAGNOSIS — M25612 Stiffness of left shoulder, not elsewhere classified: Secondary | ICD-10-CM

## 2021-03-24 DIAGNOSIS — M25512 Pain in left shoulder: Secondary | ICD-10-CM | POA: Diagnosis not present
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
## 2021-03-31 ENCOUNTER — Other Ambulatory Visit: Payer: Self-pay

## 2021-03-31 ENCOUNTER — Ambulatory Visit (INDEPENDENT_AMBULATORY_CARE_PROVIDER_SITE_OTHER): Payer: Medicare Other | Admitting: Physical Therapy

## 2021-03-31 ENCOUNTER — Encounter: Payer: Self-pay | Admitting: Physical Therapy

## 2021-03-31 DIAGNOSIS — M25512 Pain in left shoulder: Secondary | ICD-10-CM | POA: Diagnosis not present

## 2021-03-31 DIAGNOSIS — M542 Cervicalgia: Secondary | ICD-10-CM | POA: Diagnosis not present

## 2021-03-31 DIAGNOSIS — M6281 Muscle weakness (generalized): Secondary | ICD-10-CM

## 2021-03-31 DIAGNOSIS — M25612 Stiffness of left shoulder, not elsewhere classified: Secondary | ICD-10-CM | POA: Diagnosis not present
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
## 2021-05-04 ENCOUNTER — Telehealth: Payer: Self-pay | Admitting: Family Medicine

## 2021-05-04 DIAGNOSIS — I10 Essential (primary) hypertension: Secondary | ICD-10-CM

## 2021-05-04 MED ORDER — VALSARTAN 80 MG PO TABS: 80.0000 mg | ORAL_TABLET | Freq: Every day | ORAL | 0 refills | Status: DC

## 2021-05-04 NOTE — Telephone Encounter (Signed)
Sent in 90 day to requested [facility] [address] per phone number provided, note on Rx stating will need appt for future refills  ?

## 2021-05-04 NOTE — Telephone Encounter (Signed)
Chief Complaint Prescription Refill or Medication Request (non ?symptomatic) ?Reason for Call Medication Question / Request ?Initial Comment Caller states, takes BP meds. Valsartan '80mg'$ . ?Needs refills. [facility] none. [facility] [phone] ?Translation No ?Nurse Assessment ?Nurse: Lissa Hoard, RN, [place] Date/Time Eilene Ghazi Time): 05/01/2021 10:16:59 PM ?Please select the assessment type ---Refill ?Additional Documentation ?---Caller states that takes BP meds, Valsartan '80mg'$  ?(out completely for two days). [facility] ?[phone] 191/96 BP ?Does the patient have enough medication to last until ?the office opens? ---No ?Nurse: Lissa Hoard, RN, [place] Date/Time Eilene Ghazi Time): 05/01/2021 10:20:37 PM ?Confirm and document reason for call. If ?symptomatic, describe symptoms. ?---Caller states that takes BP meds, Valsartan '80mg'$  ?(out completely for two days). [facility] [phone] ?191/96 BP currently, felt tired, denies other symptoms ?Does the patient have any new or worsening ?symptoms? ---Yes ?Will a triage be completed? ---Yes ?Related visit to physician within the last 2 weeks? ---No ?Does the PT have any chronic conditions? (i.e. ?diabetes, asthma, this includes High risk factors for ?pregnancy, etc.) ?---Yes ?List chronic conditions. ---HTN ?Is this a behavioral health or substance abuse call? ---No ?PLEASE NOTE: All timestamps contained within this report are represented as [place] Standard Time. ?CONFIDENTIALTY NOTICE: This fax transmission is intended only for the addressee. It contains information that is legally privileged, confidential or ?otherwise protected from use or disclosure. If you are not the intended recipient, you are strictly prohibited from reviewing, disclosing, copying using ?or disseminating any of this information or taking any action in reliance on or regarding this information. If you have received this fax in error, please ?notify us immediately by telephone so that we can arrange for its return  to [place]. Phone: [phone], Toll-Free: [phone], Fax: [phone] ?Page: 2 of 3 ?Call Id: [id number] ?Guidelines ?Guideline Title Affirmed Question Affirmed Notes Nurse Date/Time (Eastern ?Time) ?Blood Pressure - ?High ?[1] Systolic BP >= ?157 OR Diastolic >= ?262 AND [0] missed ?most recent dose ?of blood pressure ?medication ?Lissa Hoard, RN, [place] 05/01/2021 10:22:22 ?PM ?Blood Pressure - ?High ?Ran out of BP ?medications ?Lissa Hoard, RN, [place] 05/02/2021 12:16:29 ?AM ?Disp. Time (Eastern ?Time) Disposition Final User ?05/01/2021 9:53:38 PM Send To Nurse Lonia Farber, RN, [place] ?05/01/2021 10:30:05 PM Urgent Home Treatment with FollowUp Call ?Lissa Hoard, RN, [place] ?05/01/2021 10:36:58 PM Pharmacy Call Lissa Hoard, RN, [place] ?[facility] ?05/01/2021 10:42:27 PM Send To RN Personal Lissa Hoard, RN, [place] ?05/02/2021 12:21:01 AM Call PCP within 24 Hours Yes Lissa Hoard, RN, [place] ?Caller Disagree/Comply Comply ?Caller Understands Yes ?PreDisposition Call Doctor ?Care Advice Given Per Guideline ?URGENT HOME TREATMENT WITH FOLLOW-UP CALL: * I'll call you back in 30-60 minutes to see how you are ?doing. * Call me back immediately if: you become worse before my follow-up call. MISSED DOSE OF BLOOD PRESSURE ?MEDICATION: * If it is more than 8 hours until your next dose, take the missed dose of medication now. HIGH BLOOD ?PRESSURE MEDICINES: CALL BACK IF: * Weakness or numbness of the face, arm or leg on one side of the body occurs * ?Difficulty walking, difficulty talking, or severe headache occurs * Chest pain or difficulty breathing occurs * You become worse ?CARE ADVICE given per High Blood Pressure (Adult) guideline. * The goal of blood pressure treatment for most people with ?hypertension is to keep the blood pressure under 140/90. For people that are 65 years or older, your doctor may instead want to keep ?the blood pressure under 150/90. ?CALL PCP WITHIN 24 HOURS: * You need to discuss this with your doctor (or NP/PA) within the  next 24 hours. * IF OFFICE ?WILL BE CLOSED: I'll page the on-call provider now. EXCEPTION: from 9 pm to 9 am. Since this isn't urgent, we'll hold the page ?until morning. HIGH BLOOD PRESSURE MEDICINES: * The goal of blood pressure treatment for most people with hypertension ?is to keep the blood pressure under 140/90. For people that are 60 years or older, your doctor may instead want to keep the blood ?pressure under 150/90. CALL BACK IF: * Weakness or numbness of the face, arm or leg on one side of the body occurs * Difficulty ?walking, difficulty talking, or severe headache occurs * Chest pain or difficulty breathing occurs * You become worse CARE ?ADVICE given per High Blood Pressure (Adult) guideline. ?PLEASE NOTE: All timestamps contained within this report are represented as [place] Standard Time. ?CONFIDENTIALTY NOTICE: This fax transmission is intended only for the addressee. It contains information that is legally privileged, confidential or ?otherwise protected from use or disclosure. If you are not the intended recipient, you are strictly prohibited from reviewing, disclosing, copying using ?or disseminating any of this information or taking any action in reliance on or regarding this information. If you have received this fax in error, please ?notify us immediately by telephone so that we can arrange for its return to [place]. Phone: [phone], Toll-Free: [phone], Fax: [phone] ?Page: 3 of 3 ?Call Id: [id number] ?Comments ?User: Corinna Gab, RN Date/Time Eilene Ghazi Time): 05/01/2021 10:26:31 PM ?[facility] that she needs [facility] that it expired ?User: Corinna Gab, RN Date/Time Eilene Ghazi Time): 05/01/2021 10:40:29 PM ?BP 149/88 on recheck when updated caller ?User: Corinna Gab, RN Date/Time Eilene Ghazi Time): 05/02/2021 12:18:23 AM ?caller states that her medication was not ready at the pharmacy and her hsb just went to get her medication; ?132/84, HR 86 ?User: Corinna Gab,  RN Date/Time Eilene Ghazi Time): 05/02/2021 12:22:21 AM ?caller states that her hsb. just walked in with her prescription that will get her through until monday, states that ?she will follow up then; discussed the the pharmacist stated that they had sent a refill request to the office and is ?waiting on a response from the office for a refill ?

## 2021-07-15 ENCOUNTER — Telehealth: Payer: Self-pay | Admitting: Family Medicine

## 2021-07-15 NOTE — Telephone Encounter (Signed)
Left message for patient to call back and schedule Medicare Annual Wellness Visit (AWV).   Please offer to do virtually or by telephone.  Left office number and my jabber [phone].  Last AWV:05/21/2019  Please schedule at anytime with Nurse Health Advisor.

## 2021-07-30 ENCOUNTER — Other Ambulatory Visit: Payer: Self-pay | Admitting: Family Medicine

## 2021-07-30 DIAGNOSIS — I10 Essential (primary) hypertension: Secondary | ICD-10-CM

## 2021-07-31 ENCOUNTER — Encounter: Payer: Self-pay | Admitting: Physician Assistant

## 2021-08-06 ENCOUNTER — Ambulatory Visit (INDEPENDENT_AMBULATORY_CARE_PROVIDER_SITE_OTHER): Payer: Medicare Other | Admitting: *Deleted

## 2021-08-06 DIAGNOSIS — Z780 Asymptomatic menopausal state: Secondary | ICD-10-CM | POA: Diagnosis not present

## 2021-08-06 DIAGNOSIS — Z0000 Encounter for general adult medical examination without abnormal findings: Secondary | ICD-10-CM | POA: Diagnosis not present

## 2021-08-06 DIAGNOSIS — Z1231 Encounter for screening mammogram for malignant neoplasm of breast: Secondary | ICD-10-CM | POA: Diagnosis not present

## 2021-08-06 NOTE — Progress Notes (Signed)
Subjective:   Kathleen Douglas is a 71 y.o. female who presents for Medicare Annual (Subsequent) preventive examination.  I connected with  Kathleen Douglas on 08/06/21 by a telephone enabled telemedicine application and verified that I am speaking with the correct person using two identifiers.   I discussed the limitations of evaluation and management by telemedicine. The patient expressed understanding and agreed to proceed.  Patient location: home  Provider location: Tele-health-home    Review of Systems      Cardiac Risk Factors include: advanced age (>75mn, >>49women)     Objective:    Today's Vitals   08/06/21 1204  PainSc: 2    There is no height or weight on file to calculate BMI.     08/06/2021   12:06 PM 02/24/2021    1:12 PM 07/08/2020    1:51 PM 06/20/2020   10:00 AM 05/21/2019    8:07 AM 02/18/2016   11:06 AM  Advanced Directives  Does Patient Have a Medical Advance Directive? No No No No No No  Would patient like information on creating a medical advance directive? No - Patient declined No - Patient declined No - Patient declined No - Patient declined Yes (ED - Information included in AVS)     Current Medications (verified) Outpatient Encounter Medications as of 08/06/2021  Medication Sig   albuterol (VENTOLIN HFA) 108 (90 Base) MCG/ACT inhaler 1-2 puffs q 4-6 hours prn for cough.   Calcium Carbonate-Vitamin D (CALCIUM + D PO) Take 2 tablets by mouth every evening. Reported on 06/05/2015   cetirizine (ZYRTEC) 10 MG tablet Take 1 tablet (10 mg total) by mouth at bedtime.   methylPREDNISolone (MEDROL DOSEPAK) 4 MG TBPK tablet Take as directed 6 day dose pak.   Multiple Vitamin (MULTIVITAMIN) tablet Take 1 tablet by mouth daily.   traMADol-acetaminophen (ULTRACET) 37.5-325 MG tablet Take 1 tablet by mouth every 6 (six) hours as needed.   valsartan (DIOVAN) 80 MG tablet TAKE 1 TABLET BY MOUTH EVERY DAY   [DISCONTINUED] loratadine (CLARITIN) 10 MG tablet  Take 1 tablet (10 mg total) by mouth daily. (Patient not taking: No sig reported)   No facility-administered encounter medications on file as of 08/06/2021.    Allergies (verified) Patient has no known allergies.   History: Past Medical History:  Diagnosis Date   Cough 10/31/2018   Osteopenia    Vitamin D deficiency    Past Surgical History:  Procedure Laterality Date   ABDOMINAL HYSTERECTOMY     BREAST BIOPSY Left    Excisional    COLONOSCOPY  2004   Dr.Martin Johnson   TUBAL LIGATION     Family History  Problem Relation Age of Onset   Lymphoma Mother    Cancer Father    Hypertension Sister    Hypertension Daughter    Cancer Maternal Grandmother    Diabetes Maternal Grandfather    Cancer Paternal Grandmother    Colon cancer Neg Hx    Esophageal cancer Neg Hx    Rectal cancer Neg Hx    Stomach cancer Neg Hx    Colon polyps Neg Hx    Social History   Socioeconomic History   Marital status: Married    Spouse name: Not on file   Number of children: Not on file   Years of education: Not on file   Highest education level: Not on file  Occupational History   Not on file  Tobacco Use   Smoking status: Never  Smokeless tobacco: Never  Vaping Use   Vaping Use: Never used  Substance and Sexual Activity   Alcohol use: Yes    Comment: socially, occ.   Drug use: No   Sexual activity: Yes    Birth control/protection: None  Other Topics Concern   Not on file  Social History Narrative   Not on file   Social Determinants of Health   Financial Resource Strain: Low Risk  (08/06/2021)   Overall Financial Resource Strain (CARDIA)    Difficulty of Paying Living Expenses: Not hard at all  Food Insecurity: No Food Insecurity (08/06/2021)   Hunger Vital Sign    Worried About Running Out of Food in the Last Year: Never true    [employer] in the Last Year: Never true  Transportation Needs: Unknown (08/06/2021)   PRAPARE - Hydrologist  (Medical): No    Lack of Transportation (Non-Medical): Not on file  Physical Activity: Sufficiently Active (08/06/2021)   Exercise Vital Sign    Days of Exercise per Week: 4 days    Minutes of Exercise per Session: 40 min  Stress: No Stress Concern Present (08/06/2021)   [place]    Feeling of Stress : Not at all  Social Connections: [place] (08/06/2021)   Social Connection and Isolation Panel [NHANES]    Frequency of Communication with Friends and Family: More than three times a week    Frequency of Social Gatherings with Friends and Family: Three times a week    Attends Religious Services: More than 4 times per year    Active Member of Clubs or Organizations: Yes    Attends Music therapist: More than 4 times per year    Marital Status: Married    Tobacco Counseling Counseling given: Not Answered   Clinical Intake:  Pre-visit preparation completed: Yes  Pain : 0-10 Pain Score: 2  Pain Type: Chronic pain Pain Location: Leg Pain Relieving Factors: cortizone shots  Pain Relieving Factors: cortizone shots  Diabetes: No  How often do you need to have someone help you when you read instructions, pamphlets, or other written materials from your doctor or pharmacy?: 1 - Never  Diabetic?  No   Interpreter Needed?: No  Information entered by :: Leroy Kennedy LPN   Activities of Daily Living    08/06/2021   12:09 PM  In your present state of health, do you have any difficulty performing the following activities:  Hearing? 0  Vision? 0  Difficulty concentrating or making decisions? 0  Walking or climbing stairs? 0  Dressing or bathing? 0  Doing errands, shopping? 0  Preparing Food and eating ? N  Using the Toilet? N  In the past six months, have you accidently leaked urine? Y  Do you have problems with loss of bowel control? N  Managing your Medications? N  Managing your Finances?  N  Housekeeping or managing your Housekeeping? N    Patient Care Team: Wendie Agreste, MD as PCP - General (Family Medicine)  Indicate any recent Medical Services you may have received from other than [facility] providers in the past year (date may be approximate).     Assessment:   This is a routine wellness examination for Kathleen Douglas.  Hearing/Vision screen Hearing Screening - Comments:: No trouble hearing Vision Screening - Comments:: Up to date [employer]  Dietary issues and exercise activities discussed: Current Exercise Habits: Home  exercise routine (standing pilates), Type of exercise: walking (total body pilates), Time (Minutes): 40, Frequency (Times/Week): 5, Weekly Exercise (Minutes/Week): 200   Goals Addressed             This Visit's Progress    Weight (lb) < 200 lb (90.7 kg)         Depression Screen    08/06/2021   12:13 PM 02/05/2021    1:37 PM 01/05/2021   11:09 AM 12/01/2020    2:44 PM 03/26/2020    8:27 AM 02/27/2020   11:40 AM 05/21/2019    8:09 AM  PHQ 2/9 Scores  PHQ - 2 Score 0 0 0 0 0 0 0    Fall Risk    08/06/2021   12:06 PM 02/05/2021    1:36 PM 01/05/2021   11:09 AM 12/01/2020    2:43 PM 03/26/2020    8:27 AM  [place] in the past year? 0 0 0 0 0  Number falls in past yr: 0 0 0 0 0  Injury with Fall? 0 0 0 0 0  Risk for fall due to :  No Fall Risks No Fall Risks No Fall Risks   Follow up Falls evaluation completed;Education provided;Falls prevention discussed Falls evaluation completed Falls evaluation completed Falls evaluation completed Falls evaluation completed    FALL RISK PREVENTION PERTAINING TO THE HOME:  Any stairs in or around the home? Yes  If so, are there any without handrails? No  Home free of loose throw rugs in walkways, pet beds, electrical cords, etc? Yes  Adequate lighting in your home to reduce risk of falls? Yes   ASSISTIVE DEVICES UTILIZED TO PREVENT FALLS:  Life alert? No  Use of a cane, walker  or w/c? No  Grab bars in the bathroom? No  Shower chair or bench in shower? No  Elevated toilet seat or a handicapped toilet? Yes   TIMED UP AND GO:  Was the test performed? No .    Cognitive Function:        08/06/2021   12:08 PM 05/21/2019    8:08 AM  6CIT Screen  What Year? 0 points 0 points  What month? 0 points 0 points  What time? 0 points   Count back from 20 0 points 0 points  Months in reverse 0 points 0 points  Repeat phrase 0 points 0 points  Total Score 0 points     Immunizations Immunization History  Administered Date(s) Administered   Fluad Quad(high Dose 65+) 03/26/2020, 12/01/2020   Influenza Split 02/21/2012   Influenza, High Dose Seasonal PF 11/30/2017   Influenza,inj,Quad PF,6+ Mos 12/30/2014, 03/03/2016   PFIZER Comirnaty(Gray Top)Covid-19 Tri-Sucrose Vaccine 03/07/2019, 03/28/2019   PFIZER(Purple Top)SARS-COV-2 Vaccination 03/06/2020, 03/27/2020, 04/01/2020   Pneumococcal Conjugate-13 02/18/2016   Pneumococcal Polysaccharide-23 02/21/2012   Tdap 09/22/2013    TDAP status: Up to date  Flu Vaccine status: Up to date  Pneumococcal vaccine status: Up to date  Covid-19 vaccine status: Information provided on how to obtain vaccines.   Qualifies for Shingles Vaccine? Yes   Zostavax completed No   Shingrix Completed?: No.    Education has been provided regarding the importance of this vaccine. Patient has been advised to call insurance company to determine out of pocket expense if they have not yet received this vaccine. Advised may also receive vaccine at local pharmacy or Health Dept. Verbalized acceptance and understanding.  Screening Tests Health Maintenance  Topic Date Due   COVID-19 Vaccine (  6 - Pfizer series) 08/22/2021 (Originally 05/27/2020)   Zoster Vaccines- Shingrix (1 of 2) 11/06/2021 (Originally 10/07/1999)   Pneumonia Vaccine 57+ Years old (3 - PPSV23 if available, else PCV20) 12/01/2021 (Originally 02/20/2017)   INFLUENZA VACCINE   09/08/2021   MAMMOGRAM  02/26/2022   COLONOSCOPY (Pts 45-64yr Insurance coverage will need to be confirmed)  01/10/2023   TETANUS/TDAP  09/23/2023   DEXA SCAN  Completed   Hepatitis C Screening  Completed   HPV VACCINES  Aged Out    Health Maintenance  There are no preventive care reminders to display for this patient.   Colorectal cancer screening: Type of screening: Colonoscopy. Completed 2019. Repeat every 5 years  Mammogram status: Ordered  . Pt provided with contact info and advised to call to schedule appt.   Bone Density status: Ordered  . Pt provided with contact info and advised to call to schedule appt.  Lung Cancer Screening: (Low Dose CT Chest recommended if Age 155-80years, 30 pack-year currently smoking OR have quit w/in 15years.) does not qualify.   Lung Cancer Screening Referral:   Additional Screening:  Hepatitis C Screening: does not qualify;   Vision Screening: Recommended annual ophthalmology exams for early detection of glaucoma and other disorders of the eye. Is the patient up to date with their annual eye exam?  Yes  Who is the provider or what is the name of the office in which the patient attends annual eye exams? Unsure of name If pt is not established with a provider, would they like to be referred to a provider to establish care? No .   Dental Screening: Recommended annual dental exams for proper oral hygiene  Community Resource Referral / Chronic Care Management: CRR required this visit?  No   CCM required this visit?  No      Plan:     I have personally reviewed and noted the following in the patient's chart:   Medical and social history Use of alcohol, tobacco or illicit drugs  Current medications and supplements including opioid prescriptions.  Functional ability and status Nutritional status Physical activity Advanced directives List of other physicians Hospitalizations, surgeries, and ER visits in previous 12  months Vitals Screenings to include cognitive, depression, and falls Referrals and appointments  In addition, I have reviewed and discussed with patient certain preventive protocols, quality metrics, and best practice recommendations. A written personalized care plan for preventive services as well as general preventive health recommendations were provided to patient.     JLeroy Kennedy LPN   [id number]  Nurse Notes:

## 2021-08-06 NOTE — Patient Instructions (Signed)
Kathleen Douglas , Thank you for taking time to come for your Medicare Wellness Visit. I appreciate your ongoing commitment to your health goals. Please review the following plan we discussed and let me know if I can assist you in the future.   Screening recommendations/referrals: Colonoscopy: up to date Mammogram: Education provided Bone Density: Education provided Recommended yearly ophthalmology/optometry visit for glaucoma screening and checkup Recommended yearly dental visit for hygiene and checkup  Vaccinations: Influenza vaccine: up to date Pneumococcal vaccine: up to date Tdap vaccine: up to date Shingles vaccine: Education provided    Advanced directives: Education provided  Conditions/risks identified:      Preventive Care 17 Years and Older, Female Preventive care refers to lifestyle choices and visits with your health care provider that can promote health and wellness. What does preventive care include? A yearly physical exam. This is also called an annual well check. Dental exams once or twice a year. Routine eye exams. Ask your health care provider how often you should have your eyes checked. Personal lifestyle choices, including: Daily care of your teeth and gums. Regular physical activity. Eating a healthy diet. Avoiding tobacco and drug use. Limiting alcohol use. Practicing safe sex. Taking low-dose aspirin every day. Taking vitamin and mineral supplements as recommended by your health care provider. What happens during an annual well check? The services and screenings done by your health care provider during your annual well check will depend on your age, overall health, lifestyle risk factors, and family history of disease. Counseling  Your health care provider may ask you questions about your: Alcohol use. Tobacco use. Drug use. Emotional well-being. Home and relationship well-being. Sexual activity. Eating habits. History of falls. Memory and ability  to understand (cognition). Work and work Statistician. Reproductive health. Screening  You may have the following tests or measurements: Height, weight, and BMI. Blood pressure. Lipid and cholesterol levels. These may be checked every 5 years, or more frequently if you are over 51 years old. Skin check. Lung cancer screening. You may have this screening every year starting at age 18 if you have a 30-pack-year history of smoking and currently smoke or have quit within the past 15 years. Fecal occult blood test (FOBT) of the stool. You may have this test every year starting at age 4. Flexible sigmoidoscopy or colonoscopy. You may have a sigmoidoscopy every 5 years or a colonoscopy every 10 years starting at age 49. Hepatitis C blood test. Hepatitis B blood test. Sexually transmitted disease (STD) testing. Diabetes screening. This is done by checking your blood sugar (glucose) after you have not eaten for a while (fasting). You may have this done every 1-3 years. Bone density scan. This is done to screen for osteoporosis. You may have this done starting at age 55. Mammogram. This may be done every 1-2 years. Talk to your health care provider about how often you should have regular mammograms. Talk with your health care provider about your test results, treatment options, and if necessary, the need for more tests. Vaccines  Your health care provider may recommend certain vaccines, such as: Influenza vaccine. This is recommended every year. Tetanus, diphtheria, and acellular pertussis (Tdap, Td) vaccine. You may need a Td booster every 10 years. Zoster vaccine. You may need this after age 25. Pneumococcal 13-valent conjugate (PCV13) vaccine. One dose is recommended after age 38. Pneumococcal polysaccharide (PPSV23) vaccine. One dose is recommended after age 62. Talk to your health care provider about which screenings and vaccines you need  and how often you need them. This information is not  intended to replace advice given to you by your health care provider. Make sure you discuss any questions you have with your health care provider. Document Released: 02/21/2015 Document Revised: 10/15/2015 Document Reviewed: 11/26/2014 Elsevier Interactive Patient Education  2017 [employer] Prevention in the Home Falls can cause injuries. They can happen to people of all ages. There are many things you can do to make your home safe and to help prevent falls. What can I do on the outside of my home? Regularly fix the edges of walkways and driveways and fix any cracks. Remove anything that might make you trip as you walk through a door, such as a raised step or threshold. Trim any bushes or trees on the path to your home. Use bright outdoor lighting. Clear any walking paths of anything that might make someone trip, such as rocks or tools. Regularly check to see if handrails are loose or broken. Make sure that both sides of any steps have handrails. Any raised decks and porches should have guardrails on the edges. Have any leaves, snow, or ice cleared regularly. Use sand or salt on walking paths during winter. Clean up any spills in your garage right away. This includes oil or grease spills. What can I do in the bathroom? Use night lights. Install grab bars by the toilet and in the tub and shower. Do not use towel bars as grab bars. Use non-skid mats or decals in the tub or shower. If you need to sit down in the shower, use a plastic, non-slip stool. Keep the floor dry. Clean up any water that spills on the floor as soon as it happens. Remove soap buildup in the tub or shower regularly. Attach bath mats securely with double-sided non-slip rug tape. Do not have throw rugs and other things on the floor that can make you trip. What can I do in the bedroom? Use night lights. Make sure that you have a light by your bed that is easy to reach. Do not use any sheets or blankets that are  too big for your bed. They should not hang down onto the floor. Have a firm chair that has side arms. You can use this for support while you get dressed. Do not have throw rugs and other things on the floor that can make you trip. What can I do in the kitchen? Clean up any spills right away. Avoid walking on wet floors. Keep items that you use a lot in easy-to-reach places. If you need to reach something above you, use a strong step stool that has a grab bar. Keep electrical cords out of the way. Do not use floor polish or wax that makes floors slippery. If you must use wax, use non-skid floor wax. Do not have throw rugs and other things on the floor that can make you trip. What can I do with my stairs? Do not leave any items on the stairs. Make sure that there are handrails on both sides of the stairs and use them. Fix handrails that are broken or loose. Make sure that handrails are as long as the stairways. Check any carpeting to make sure that it is firmly attached to the stairs. Fix any carpet that is loose or worn. Avoid having throw rugs at the top or bottom of the stairs. If you do have throw rugs, attach them to the floor with carpet tape. Make sure that you  have a light switch at the top of the stairs and the bottom of the stairs. If you do not have them, ask someone to add them for you. What else can I do to help prevent falls? Wear shoes that: Do not have high heels. Have rubber bottoms. Are comfortable and fit you well. Are closed at the toe. Do not wear sandals. If you use a stepladder: Make sure that it is fully opened. Do not climb a closed stepladder. Make sure that both sides of the stepladder are locked into place. Ask someone to hold it for you, if possible. Clearly mark and make sure that you can see: Any grab bars or handrails. First and last steps. Where the edge of each step is. Use tools that help you move around (mobility aids) if they are needed. These  include: Canes. Walkers. Scooters. Crutches. Turn on the lights when you go into a dark area. Replace any light bulbs as soon as they burn out. Set up your furniture so you have a clear path. Avoid moving your furniture around. If any of your floors are uneven, fix them. If there are any pets around you, be aware of where they are. Review your medicines with your doctor. Some medicines can make you feel dizzy. This can increase your chance of falling. Ask your doctor what other things that you can do to help prevent falls. This information is not intended to replace advice given to you by your health care provider. Make sure you discuss any questions you have with your health care provider. Document Released: 11/21/2008 Document Revised: 07/03/2015 Document Reviewed: 03/01/2014 Elsevier Interactive Patient Education  2017 [employer].

## 2021-08-25 NOTE — Progress Notes (Signed)
08/27/2021 Kathleen Douglas [id number] 08-13-1950  Referring provider: Wendie Agreste, MD Primary GI doctor: Dr. Fuller Plan  ASSESSMENT AND PLAN:   Change in bowel habit Constipation worse last 3-6 months, with new fecal incontinence Will check thyroid with weight gain, increase fiber, add miralax daily.  Sounds like component of pelvic floor, add squatty potty, if colon negative consider anal rectal manometry/pelvic floor PT -     CBC with Differential/Platelet; Future -     Comprehensive metabolic panel; Future -     TSH; Future  Fecal occult blood test positive One episode of dark/BRB in toilet, FOBT positive today Possible healing rectal fissure posteriorly and internal hemorrhoids appreciated but will check labs for anemia and schedule for colonoscopy as patient is due next year.  We have discussed the risks of bleeding, infection, perforation, medication reactions, and remote risk of death associated with colonoscopy. All questions were answered and the patient acknowledges these risk and wishes to proceed. -     CBC with Differential/Platelet; Future -     Comprehensive metabolic panel; Future -     TSH; Future  Fecal incontinence New the last 4 weeks Soft stool on exam, with history more likely pelvic floor related.     Patient Care Team: Wendie Agreste, MD as PCP - General (Family Medicine)  HISTORY OF PRESENT ILLNESS: 71 y.o. female with a past medical history of HTN, asthma, vit D def and others listed below presents for evaluation of rectal bleeding.   01/09/2018 colonoscopy for screening purposes with Dr. Fuller Plan 7 mm polyp sigmoid colon semipedunculated tubular adenoma, recall 5 years. (01/2023) 03/26/2020 hemoglobin 13.4 no anemia  Her and her husband went to [place] and had a lot of beets at [employer]. She had BM that evening with red appears stools, however her husband had the same thing since that time.  She has not seen any other "blood" since  that time  She states she is normally very regular with BMs however she has constipation with some fecal incontinence/leaking.  No AB pain, fever, chills.  No new medications.  She has had some weight gain.  She states she has not been eating as much fiber in last year due to planning a wedding but she does exercise daily.  Denies GERD, nausea, vomiting. No melena.    Current Medications:    Current Outpatient Medications (Cardiovascular):    valsartan (DIOVAN) 80 MG tablet, TAKE 1 TABLET BY MOUTH EVERY DAY  Current Outpatient Medications (Respiratory):    albuterol (VENTOLIN HFA) 108 (90 Base) MCG/ACT inhaler, 1-2 puffs q 4-6 hours prn for cough.   cetirizine (ZYRTEC) 10 MG tablet, Take 1 tablet (10 mg total) by mouth at bedtime. (Patient not taking: Reported on 08/27/2021)    Current Outpatient Medications (Other):    Calcium Carbonate-Vitamin D (CALCIUM + D PO), Take 2 tablets by mouth every evening. Reported on 06/05/2015   Multiple Vitamin (MULTIVITAMIN) tablet, Take 1 tablet by mouth daily.  Medical History:  Past Medical History:  Diagnosis Date   Cough 10/31/2018   HLD (hyperlipidemia)    HTN (hypertension)    Osteopenia    Seasonal allergies    Vitamin D deficiency    Allergies: No Known Allergies   Surgical History:  She  has a past surgical history that includes Abdominal hysterectomy; Tubal ligation; Breast biopsy (Left); and Colonoscopy (2004). Family History:  Her family history includes Bladder Cancer in her father; Breast cancer in her paternal aunt  and paternal grandmother; Colon cancer (age of onset: 89) in her maternal grandmother; Diabetes in her maternal grandfather; Glaucoma in her father; Hypertension in her daughter, father, and sister; Lymphoma in her mother. Social History:   reports that she has never smoked. She has never used smokeless tobacco. She reports current alcohol use. She reports that she does not use drugs.  REVIEW OF SYSTEMS  : All  other systems reviewed and negative except where noted in the History of Present Illness.   PHYSICAL EXAM: BP 126/80 (BP Location: Left Arm, Patient Position: Sitting, Cuff Size: Normal)   Pulse 76   Ht '5\' 2"'$  (1.575 m) Comment: height measured without shoes  Wt 160 lb 8 oz (72.8 kg)   BMI 29.36 kg/m  General:   Pleasant, well developed female in no acute distress Head:   Normocephalic and atraumatic. Eyes:  sclerae anicteric,conjunctive pink  Heart:   regular rate and rhythm, holosytolic murmur best RSB Pulm:  Clear anteriorly; no wheezing Abdomen:   Soft, Non-distended AB, Active bowel sounds. No tenderness . Without guarding and Without rebound, No organomegaly appreciated. Rectal: Normal external rectal exam with possible healing posterior fissure, decreased rectal tone with cavernous rectal vault, appreciated internal hemorrhoids, non-tender, no masses, soft large amount of brown stool, hemoccult Positive Extremities:  Without edema. Msk: Symmetrical without gross deformities. Peripheral pulses intact.  Neurologic:  Alert and  oriented x4;  No focal deficits.  Skin:   Dry and intact without significant lesions or rashes. Psychiatric:  Cooperative. Normal mood and affect.    Vladimir Crofts, PA-C 9:43 AM

## 2021-08-27 ENCOUNTER — Encounter: Payer: Self-pay | Admitting: Physician Assistant

## 2021-08-27 ENCOUNTER — Other Ambulatory Visit (INDEPENDENT_AMBULATORY_CARE_PROVIDER_SITE_OTHER): Payer: Medicare Other

## 2021-08-27 ENCOUNTER — Ambulatory Visit (INDEPENDENT_AMBULATORY_CARE_PROVIDER_SITE_OTHER): Payer: Medicare Other | Admitting: Physician Assistant

## 2021-08-27 VITALS — BP 126/80 | HR 76 | Ht 62.0 in | Wt 160.5 lb

## 2021-08-27 DIAGNOSIS — R195 Other fecal abnormalities: Secondary | ICD-10-CM

## 2021-08-27 DIAGNOSIS — R194 Change in bowel habit: Secondary | ICD-10-CM | POA: Diagnosis not present

## 2021-08-27 DIAGNOSIS — R159 Full incontinence of feces: Secondary | ICD-10-CM | POA: Diagnosis not present

## 2021-08-27 LAB — CBC WITH DIFFERENTIAL/PLATELET
Basophils Absolute: 0 10*3/uL (ref 0.0–0.1)
Basophils Relative: 1.1 % (ref 0.0–3.0)
Eosinophils Absolute: 0 10*3/uL (ref 0.0–0.7)
Eosinophils Relative: 0.8 % (ref 0.0–5.0)
HCT: 38.3 % (ref 36.0–46.0)
Hemoglobin: 12.4 g/dL (ref 12.0–15.0)
Lymphocytes Relative: 26.5 % (ref 12.0–46.0)
Lymphs Abs: 1 10*3/uL (ref 0.7–4.0)
MCHC: 32.5 g/dL (ref 30.0–36.0)
MCV: 86.9 fl (ref 78.0–100.0)
Monocytes Absolute: 0.3 10*3/uL (ref 0.1–1.0)
Monocytes Relative: 8.1 % (ref 3.0–12.0)
Neutro Abs: 2.5 10*3/uL (ref 1.4–7.7)
Neutrophils Relative %: 63.5 % (ref 43.0–77.0)
Platelets: 177 10*3/uL (ref 150.0–400.0)
RBC: 4.41 Mil/uL (ref 3.87–5.11)
RDW: 14.7 % (ref 11.5–15.5)
WBC: 3.9 10*3/uL — ABNORMAL LOW (ref 4.0–10.5)

## 2021-08-27 LAB — COMPREHENSIVE METABOLIC PANEL
ALT: 22 U/L (ref 0–35)
AST: 21 U/L (ref 0–37)
Albumin: 4.2 g/dL (ref 3.5–5.2)
Alkaline Phosphatase: 78 U/L (ref 39–117)
BUN: 20 mg/dL (ref 6–23)
CO2: 29 mEq/L (ref 19–32)
Calcium: 9.4 mg/dL (ref 8.4–10.5)
Chloride: 103 mEq/L (ref 96–112)
Creatinine, Ser: 0.78 mg/dL (ref 0.40–1.20)
GFR: 76.16 mL/min (ref >60.00)
Glucose, Bld: 91 mg/dL (ref 70–99)
Potassium: 4.2 mEq/L (ref 3.5–5.1)
Sodium: 139 mEq/L (ref 135–145)
Total Bilirubin: 0.6 mg/dL (ref 0.2–1.2)
Total Protein: 6.9 g/dL (ref 6.0–8.3)

## 2021-08-27 LAB — TSH: TSH: 1.81 u[IU]/mL (ref 0.35–5.50)

## 2021-08-27 MED ORDER — NA SULFATE-K SULFATE-MG SULF 17.5-3.13-1.6 GM/177ML PO SOLN: 1.0000 | Freq: Once | ORAL | 0 refills | Status: AC

## 2021-08-27 NOTE — Patient Instructions (Addendum)
If you are age 72 or older, your body mass index should be between 23-30. Your Body mass index is 29.36 kg/m. If this is out of the aforementioned range listed, please consider follow up with your Primary Care Provider. ________________________________________________________  The [facility] providers would like to encourage you to use [facility] to communicate with providers for non-urgent requests or questions.  Due to long hold times on the telephone, sending your provider a message by [facility] may be a faster and more efficient way to get a response.  Please allow 48 business hours for a response.  Please remember that this is for non-urgent requests.  _______________________________________________________  Your provider has requested that you go to the basement level for lab work before leaving today. Press "B" on the elevator. The lab is located at the first door on the left as you exit the elevator.  You have been scheduled for a colonoscopy. Please follow written instructions given to you at your visit today.  Please pick up your prep supplies at the pharmacy within the next 1-3 days. If you use inhalers (even only as needed), please bring them with you on the day of your procedure.   Toileting tips to help with your constipation - Drink at least 64-80 ounces of water/liquid per day. - Establish a time to try to move your bowels every day.  For many people, this is after a cup of coffee or after a meal such as breakfast. - Sit all of the way back on the toilet keeping your back fairly straight and while sitting up, try to rest the tops of your forearms on your upper thighs.   - Raising your feet with a step stool/squatty potty can be helpful to improve the angle that allows your stool to pass through the rectum. - Relax the rectum feeling it bulge toward the toilet water.  If you feel your rectum raising toward your body, you are contracting rather than relaxing. - Breathe in and slowly  exhale. "Belly breath" by expanding your belly towards your belly button. Keep belly expanded as you gently direct pressure down and back to the anus.  A low pitched GRRR sound can assist with increasing intra-abdominal pressure.  - Repeat 3-4 times. If unsuccessful, contract the pelvic floor to restore normal tone and get off the toilet.  Avoid excessive straining. - To reduce excessive wiping by teaching your anus to normally contract, place hands on outer aspect of knees and resist knee movement outward.  Hold 5-10 second then place hands just inside of knees and resist inward movement of knees.  Hold 5 seconds.  Repeat a few times each way.  Here some information about pelvic floor dysfunction. This may be contributing to some of your symptoms.  I do want you to consider adding on fiber supplement with low-dose MiraLAX daily. We could also refer to pelvic floor physical therapy.  Follow up pending.  Thank you for entrusting me with your care and choosing [facility].  Vicie Mutters, PA-C   Pelvic Floor Dysfunction, Female Pelvic floor dysfunction (PFD) is a condition that results when the group of muscles and connective tissues that support the organs in the pelvis (pelvic floor muscles) do not work well. These muscles and their connections form a sling that supports the colon and bladder. In women, they also support the uterus. PFD causes pelvic floor muscles to be too weak, too tight, or both. In PFD, muscle movements are not coordinated. This may cause  bowel or bladder problems. It may also cause pain. What are the causes? This condition may be caused by an injury to the pelvic area or by a weakening of pelvic muscles. This often results from pregnancy and childbirth or other types of strain. In many cases, the exact cause is not known. What increases the risk? The following factors may make you more likely to develop this condition: Having chronic bladder tissue inflammation  (interstitial cystitis). Being an older person. Being overweight. History of radiation treatment for cancer in the pelvic region. Previous pelvic surgery, such as removal of the uterus (hysterectomy). What are the signs or symptoms? Symptoms of this condition vary and may include: Bladder symptoms, such as: Trouble starting urination and emptying the bladder. Frequent urinary tract infections. Leaking urine when coughing, laughing, or exercising (stress incontinence). Having to pass urine urgently or frequently. Pain when passing urine. Bowel symptoms, such as: Constipation. Urgent or frequent bowel movements. Incomplete bowel movements. Painful bowel movements. Leaking stool or gas. Unexplained genital or rectal pain. Genital or rectal muscle spasms. Low back pain. Other symptoms may include: A heavy, full, or aching feeling in the vagina. A bulge that protrudes into the vagina. Pain during or after sex. How is this diagnosed? This condition may be diagnosed based on: Your symptoms and medical history. A physical exam. During the exam, your health care provider may check your pelvic muscles for tightness, spasm, pain, or weakness. This may include a rectal exam and a pelvic exam. In some cases, you may have diagnostic tests, such as: Electrical muscle function tests. Urine flow testing. X-ray tests of bowel function. Ultrasound of the pelvic organs. How is this treated? Treatment for this condition depends on the symptoms. Treatment options include: Physical therapy. This may include Kegel exercises to help relax or strengthen the pelvic floor muscles. Biofeedback. This type of therapy provides feedback on how tight your pelvic floor muscles are so that you can learn to control them. Internal or external massage therapy. A treatment that involves electrical stimulation of the pelvic floor muscles to help control pain (transcutaneous electrical nerve stimulation, or  TENS). Sound wave therapy (ultrasound) to reduce muscle spasms. Medicines, such as: Muscle relaxants. Bladder control medicines. Surgery to reconstruct or support pelvic floor muscles may be an option if other treatments do not help. Follow these instructions at home: Activity Do your usual activities as told by your health care provider. Ask your health care provider if you should modify any activities. Do pelvic floor strengthening or relaxing exercises at home as told by your physical therapist. Lifestyle Maintain a healthy weight. Eat foods that are high in fiber, such as beans, whole grains, and fresh fruits and vegetables. Limit foods that are high in fat and processed sugars, such as fried or sweet foods. Manage stress with relaxation techniques such as yoga or meditation. General instructions If you have problems with leakage: Use absorbable pads or wear padded underwear. Wash frequently with mild soap. Keep your genital and anal area as clean and dry as possible. Ask your health care provider if you should try a barrier cream to prevent skin irritation. Take warm baths to relieve pelvic muscle tension or spasms. Take over-the-counter and prescription medicines only as told by your health care provider. Keep all follow-up visits. How is this prevented? The cause of PFD is not always known, but there are a few things you can do to reduce the risk of developing this condition, including: Staying at a healthy weight.  Getting regular exercise. Managing stress. Contact a health care provider if: Your symptoms are not improving with home care. You have signs or symptoms of PFD that get worse at home. You develop new signs or symptoms. You have signs of a urinary tract infection, such as: Fever. Chills. Increased urinary frequency. A burning feeling when urinating. You have not had a bowel movement in 3 days (constipation). Summary Pelvic floor dysfunction results when the  muscles and connective tissues in your pelvic floor do not work well. These muscles and their connections form a sling that supports your colon and bladder. In women, they also support the uterus. PFD may be caused by an injury to the pelvic area or by a weakening of pelvic muscles. PFD causes pelvic floor muscles to be too weak, too tight, or a combination of both. Symptoms may vary from person to person. In most cases, PFD can be treated with physical therapies and medicines. Surgery may be an option if other treatments do not help. This information is not intended to replace advice given to you by your health care provider. Make sure you discuss any questions you have with your health care provider. Document Revised: 06/04/2020 Document Reviewed: 06/04/2020 Elsevier Patient Education  [place].

## 2021-08-31 ENCOUNTER — Telehealth: Payer: Self-pay | Admitting: Gastroenterology

## 2021-08-31 NOTE — Telephone Encounter (Signed)
Left message for patient to call back  

## 2021-08-31 NOTE — Telephone Encounter (Signed)
Patient came to the 2nd floor to discuss lab results, due to long phone waits. Results have been given, no further questions.

## 2021-08-31 NOTE — Telephone Encounter (Signed)
Called patient on 08/31/21 to remind him of his procedure coming up on September 08, 2021.  Patient stated that he wanted to know results of blood work that he done on 08/27/21 before his procedure.    He stated that he has called and no one has returned his call regarding blood work.

## 2021-09-08 ENCOUNTER — Encounter: Payer: Self-pay | Admitting: Gastroenterology

## 2021-09-08 ENCOUNTER — Ambulatory Visit (AMBULATORY_SURGERY_CENTER): Payer: Medicare Other | Admitting: Gastroenterology

## 2021-09-08 VITALS — BP 134/78 | HR 87 | Temp 98.6°F | Resp 12 | Ht 62.0 in | Wt 160.0 lb

## 2021-09-08 DIAGNOSIS — R195 Other fecal abnormalities: Secondary | ICD-10-CM

## 2021-09-08 DIAGNOSIS — R194 Change in bowel habit: Secondary | ICD-10-CM | POA: Diagnosis not present

## 2021-09-08 DIAGNOSIS — Z86010 Personal history of colonic polyps: Secondary | ICD-10-CM

## 2021-09-08 MED ORDER — SODIUM CHLORIDE 0.9 % IV SOLN: 500.0000 mL | Freq: Once | INTRAVENOUS | Status: DC

## 2021-09-08 NOTE — Progress Notes (Signed)
VSS, transported to PACU °

## 2021-09-08 NOTE — Op Note (Signed)
[facility] Patient Name: Kathleen Douglas Procedure Date: 09/08/2021 8:21 AM MRN: [id number] Endoscopist: Ladene Artist , MD Age: 71 Referring MD:  Date of Birth: 09/30/1950 Gender: Female Account #: [id number] Procedure:                Colonoscopy Indications:              Heme positive stool, Change in bowel habits,                            Personal history of adenomatous colon polyp Medicines:                Monitored Anesthesia Care Procedure:                Pre-Anesthesia Assessment:                           - Prior to the procedure, a History and Physical                            was performed, and patient medications and                            allergies were reviewed. The patient's tolerance of                            previous anesthesia was also reviewed. The risks                            and benefits of the procedure and the sedation                            options and risks were discussed with the patient.                            All questions were answered, and informed consent                            was obtained. Prior Anticoagulants: The patient has                            taken no previous anticoagulant or antiplatelet                            agents. ASA Grade Assessment: II - A patient with                            mild systemic disease. After reviewing the risks                            and benefits, the patient was deemed in                            satisfactory condition to undergo the procedure.  After obtaining informed consent, the colonoscope                            was passed under direct vision. Throughout the                            procedure, the patient's blood pressure, pulse, and                            oxygen saturations were monitored continuously. The                            CF [id number] #[id number] was introduced through the anus                            and advanced to the  the cecum, identified by                            appendiceal orifice and ileocecal valve. The                            ileocecal valve, appendiceal orifice, and rectum                            were photographed. The quality of the bowel                            preparation was excellent. The colonoscopy was                            performed without difficulty. The patient tolerated                            the procedure well. Scope In: 8:33:29 AM Scope Out: 8:48:43 AM Scope Withdrawal Time: 0 hours 12 minutes 13 seconds  Total Procedure Duration: 0 hours 15 minutes 14 seconds  Findings:                 The perianal and digital rectal examinations were                            normal.                           The entire examined colon appeared normal on direct                            and retroflexion views. Complications:            No immediate complications. Estimated blood loss:                            None. Estimated Blood Loss:     Estimated blood loss: none. Impression:               - The entire examined colon is normal on direct and  retroflexion views.                           - No specimens collected. Recommendation:           - Patient has a contact number available for                            emergencies. The signs and symptoms of potential                            delayed complications were discussed with the                            patient. Return to normal activities tomorrow.                            Written discharge instructions were provided to the                            patient.                           - Resume previous diet.                           - Continue present medications.                           - No repeat colonoscopy due to age and the absence                            of colonic polyps. Ladene Artist, MD 09/08/2021 8:51:56 AM This report has been signed electronically.

## 2021-09-08 NOTE — Patient Instructions (Signed)
Discharge instructions given. Normal exam. Resume previous medications. YOU HAD AN ENDOSCOPIC PROCEDURE TODAY AT [facility]:   Refer to the procedure report that was given to you for any specific questions about what was found during the examination.  If the procedure report does not answer your questions, please call your gastroenterologist to clarify.  If you requested that your care partner not be given the details of your procedure findings, then the procedure report has been included in a sealed envelope for you to review at your convenience later.  YOU SHOULD EXPECT: Some feelings of bloating in the abdomen. Passage of more gas than usual.  Walking can help get rid of the air that was put into your GI tract during the procedure and reduce the bloating. If you had a lower endoscopy (such as a colonoscopy or flexible sigmoidoscopy) you may notice spotting of blood in your stool or on the toilet paper. If you underwent a bowel prep for your procedure, you may not have a normal bowel movement for a few days.  Please Note:  You might notice some irritation and congestion in your nose or some drainage.  This is from the oxygen used during your procedure.  There is no need for concern and it should clear up in a day or so.  SYMPTOMS TO REPORT IMMEDIATELY:  Following lower endoscopy (colonoscopy or flexible sigmoidoscopy):  Excessive amounts of blood in the stool  Significant tenderness or worsening of abdominal pains  Swelling of the abdomen that is new, acute  Fever of 100F or higher   For urgent or emergent issues, a gastroenterologist can be reached at any hour by calling [phone]. Do not use MyChart messaging for urgent concerns.    DIET:  We do recommend a small meal at first, but then you may proceed to your regular diet.  Drink plenty of fluids but you should avoid alcoholic beverages for 24 hours.  ACTIVITY:  You should plan to take it easy for the rest of  today and you should NOT DRIVE or use heavy machinery until tomorrow (because of the sedation medicines used during the test).    FOLLOW UP: Our staff will call the number listed on your records the next business day following your procedure.  We will call around 7:15- 8:00 am to check on you and address any questions or concerns that you may have regarding the information given to you following your procedure. If we do not reach you, we will leave a message.  If you develop any symptoms (ie: fever, flu-like symptoms, shortness of breath, cough etc.) before then, please call [phone].  If you test positive for Covid 19 in the 2 weeks post procedure, please call and report this information to [place].    If any biopsies were taken you will be contacted by phone or by letter within the next 1-3 weeks.  Please call us at [phone] if you have not heard about the biopsies in 3 weeks.    SIGNATURES/CONFIDENTIALITY: You and/or your care partner have signed paperwork which will be entered into your electronic medical record.  These signatures attest to the fact that that the information above on your After Visit Summary has been reviewed and is understood.  Full responsibility of the confidentiality of this discharge information lies with you and/or your care-partner.

## 2021-09-08 NOTE — Progress Notes (Signed)
See 08/27/2021 H&P, no changes.

## 2021-09-08 NOTE — Progress Notes (Signed)
Pt's states no medical or surgical changes since previsit or office visit. 

## 2021-09-09 ENCOUNTER — Telehealth: Payer: Self-pay

## 2021-09-09 NOTE — Telephone Encounter (Signed)
No answer, left message to call if having any issues or concerns, B.Tyeasha Ebbs RN 

## 2021-10-07 ENCOUNTER — Ambulatory Visit (INDEPENDENT_AMBULATORY_CARE_PROVIDER_SITE_OTHER): Payer: Medicare Other

## 2021-10-07 ENCOUNTER — Ambulatory Visit (INDEPENDENT_AMBULATORY_CARE_PROVIDER_SITE_OTHER): Payer: Medicare Other | Admitting: Specialist

## 2021-10-07 ENCOUNTER — Encounter: Payer: Self-pay | Admitting: Specialist

## 2021-10-07 VITALS — BP 147/92 | HR 69 | Ht 62.0 in | Wt 161.0 lb

## 2021-10-07 DIAGNOSIS — M1712 Unilateral primary osteoarthritis, left knee: Secondary | ICD-10-CM

## 2021-10-07 DIAGNOSIS — M222X1 Patellofemoral disorders, right knee: Secondary | ICD-10-CM | POA: Diagnosis not present

## 2021-10-07 DIAGNOSIS — M25562 Pain in left knee: Secondary | ICD-10-CM

## 2021-10-07 NOTE — Progress Notes (Signed)
Office Visit Note   Patient: Kathleen Douglas           Date of Birth: 1950-07-13           MRN: [id number] Visit Date: 10/07/2021              Requested by: Wendie Agreste, MD [address] [place],  [place] [address] PCP: Wendie Agreste, MD   Assessment & Plan: Visit Diagnoses:  1. Left knee pain, unspecified chronicity     Plan: Plan: Knee is suffering from osteoarthritis, only real proven treatments are Ice the knee that is suffering from osteoarthritis, only real proven treatments are Weight loss, NSIADs like diclofenac and exercise. Well padded shoes help. Ice the knee 2-3 times a day 15-20 mins at a time.3 times a day 15-20 mins at a time. Hot showers in the AM.  Injection with steroid may be of benefit. Cane in the left hand to use with left leg weight bearing.  Hemp CBD capsules, amazon.com 5,000-7,000 mg per bottle, 60 capsules per bottle, take one capsule twice a day.  Follow-Up Instructions: No follow-ups on file.    Follow-Up Instructions: No follow-ups on file.   Orders:  Orders Placed This Encounter  Procedures   XR KNEE 3 VIEW LEFT   No orders of the defined types were placed in this encounter.     Procedures: No procedures performed   Clinical Data: No additional findings.   Subjective: Chief Complaint  Patient presents with   Left Knee - Pain    HPI  Review of Systems   Objective: Vital Signs: BP (!) 147/92 (BP Location: Left Arm, Patient Position: Sitting)   Pulse 69   Ht '5\' 2"'$  (1.575 m)   Wt 161 lb (73 kg)   BMI 29.45 kg/m   Physical Exam  Ortho Exam  Specialty Comments:  No specialty comments available.  Imaging: XR KNEE 3 VIEW LEFT  Result Date: 10/07/2021 Standing bilateral knees and lateral radiographs of the left knee and patella sunrise views show mild narrowing of the medial joint line left knee and osteosclerosis of the left medial femoral condyle, left medial tibial plateau. Posterior patella with  mild joint incongruity, there is soft tissue swelling over the posterior left knee that may represent a popliteal cyst.     PMFS History: Patient Active Problem List   Diagnosis Date Noted   Cough 10/31/2018   Dyslipidemia 03/03/2016   Vitamin D deficiency 03/03/2016   Past Medical History:  Diagnosis Date   Cough 10/31/2018   HLD (hyperlipidemia)    HTN (hypertension)    Osteopenia    Seasonal allergies    Vitamin D deficiency     Family History  Problem Relation Age of Onset   Lymphoma Mother    Bladder Cancer Father    Hypertension Father    Glaucoma Father    Hypertension Sister    Colon cancer Maternal Grandmother 42   Diabetes Maternal Grandfather    Breast cancer Paternal Grandmother    Hypertension Daughter    Breast cancer Paternal Aunt        x 2   Esophageal cancer Neg Hx    Rectal cancer Neg Hx    Stomach cancer Neg Hx    Colon polyps Neg Hx     Past Surgical History:  Procedure Laterality Date   ABDOMINAL HYSTERECTOMY     BREAST BIOPSY Left    Excisional    COLONOSCOPY  2004  Dr.Martin Wynetta Emery   TUBAL LIGATION     Social History   Occupational History   Occupation: Oceanographer  Tobacco Use   Smoking status: Never   Smokeless tobacco: Never  Vaping Use   Vaping Use: Never used  Substance and Sexual Activity   Alcohol use: Yes    Comment: socially, occ.   Drug use: No   Sexual activity: Yes    Birth control/protection: None

## 2021-10-07 NOTE — Patient Instructions (Addendum)
Plan: Knee is suffering from osteoarthritis, only real proven treatments are Ice the knee that is suffering from osteoarthritis, only real proven treatments are Weight loss, NSIADs like diclofenac and exercise. Well padded shoes help. Ice the knee 2-3 times a day 15-20 mins at a time 3 times a day 15-20 mins at a time. Hot showers in the AM.  Injection with steroid may be of benefit. Cane in the left hand to use with left leg weight bearing. Follow-Up Instructions: No follow-ups on file.   Hemp CBD capsules, amazon.com 5,000-7,000 mg per bottle, 60 capsules per bottle, take one capsule twice a day.

## 2021-11-02 ENCOUNTER — Other Ambulatory Visit: Payer: Self-pay | Admitting: Family Medicine

## 2021-11-02 DIAGNOSIS — I10 Essential (primary) hypertension: Secondary | ICD-10-CM

## 2022-01-21 ENCOUNTER — Ambulatory Visit: Payer: Medicare Other | Admitting: Nurse Practitioner

## 2022-01-30 ENCOUNTER — Other Ambulatory Visit: Payer: Self-pay | Admitting: Family Medicine

## 2022-01-30 DIAGNOSIS — I10 Essential (primary) hypertension: Secondary | ICD-10-CM

## 2022-02-16 IMAGING — US US EXTREM LOW*L* LIMITED
1 series · 14 of 15 positions shown · non-contrast
Comparison: None.

CLINICAL DATA: Lipoma left ankle

EXAM:
ULTRASOUND LEFT LOWER EXTREMITY LIMITED
TECHNIQUE: Ultrasound examination of the lower extremity soft tissues was
performed in the area of clinical concern.

[Series 1: us extrem low*left* limited · 0.03mm/px · 15 acquisitions, 14 frames shown]
[im 1/15]
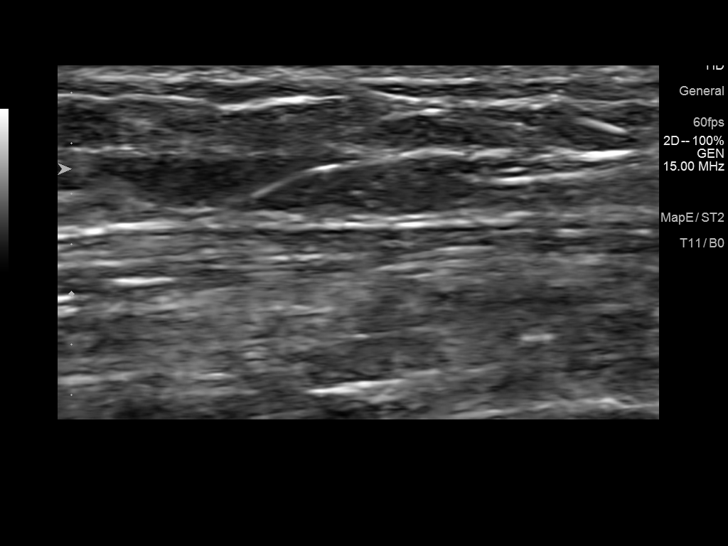
[im 2/15]
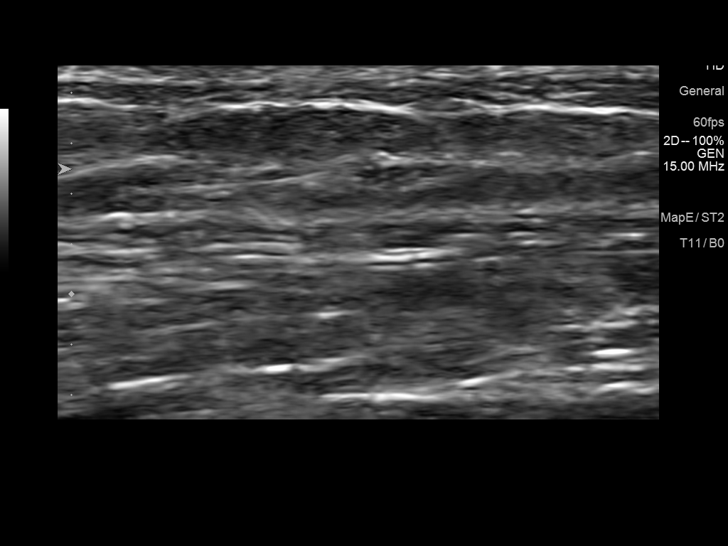
[im 3/15]
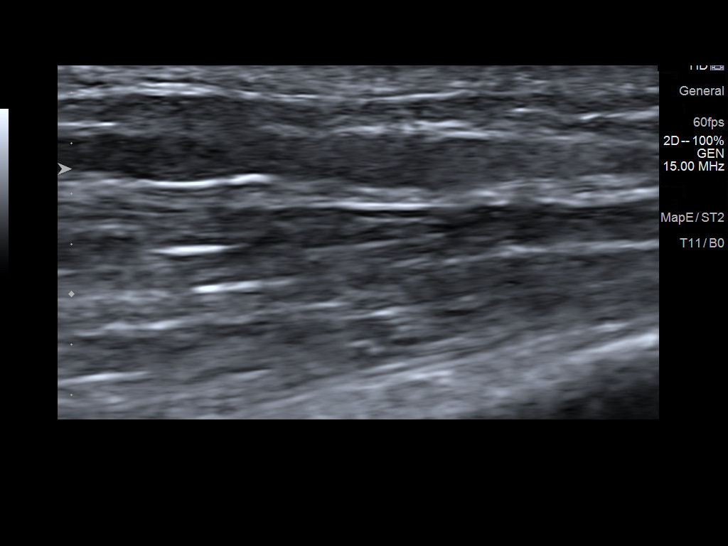
[im 4/15]
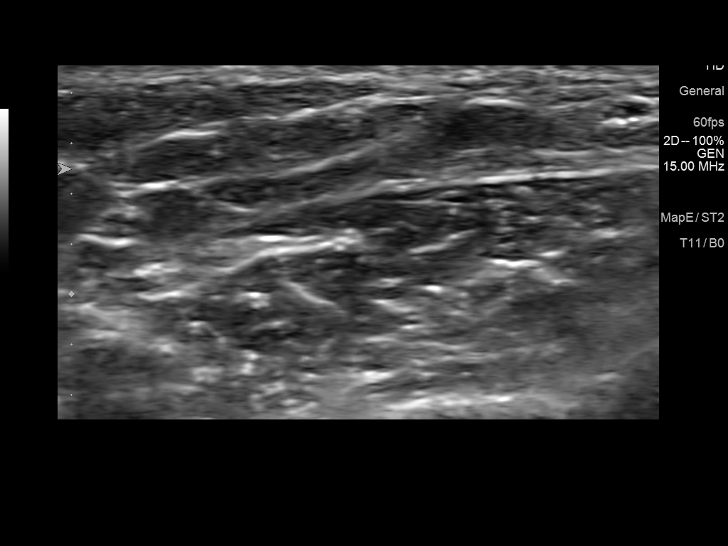
[im 5/15]
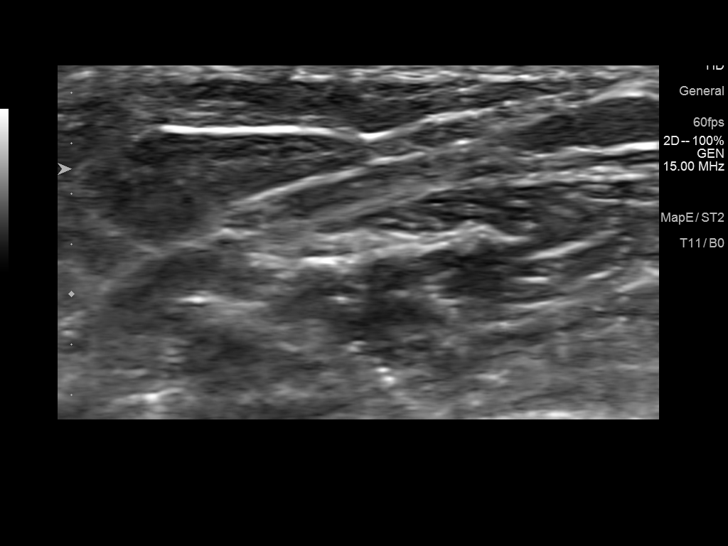
[im 6/15]
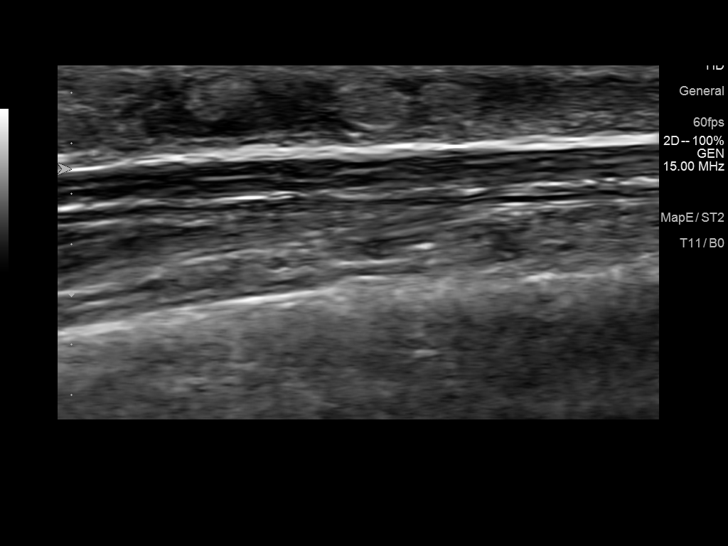
[im 7/15]
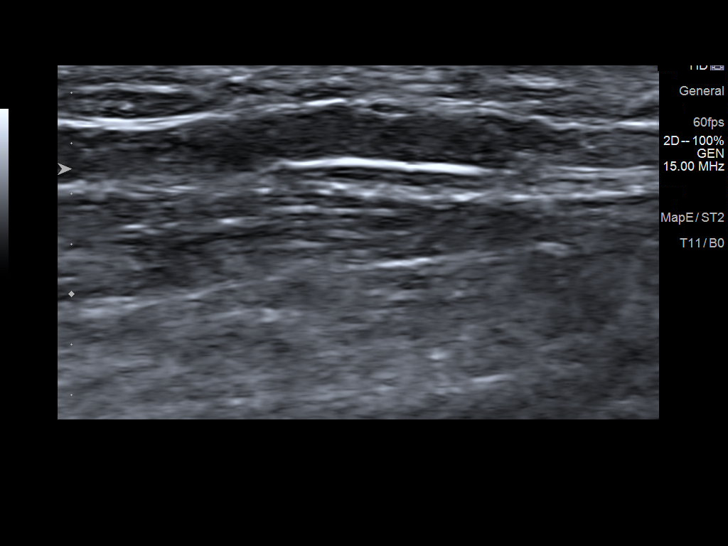
[im 9/15]
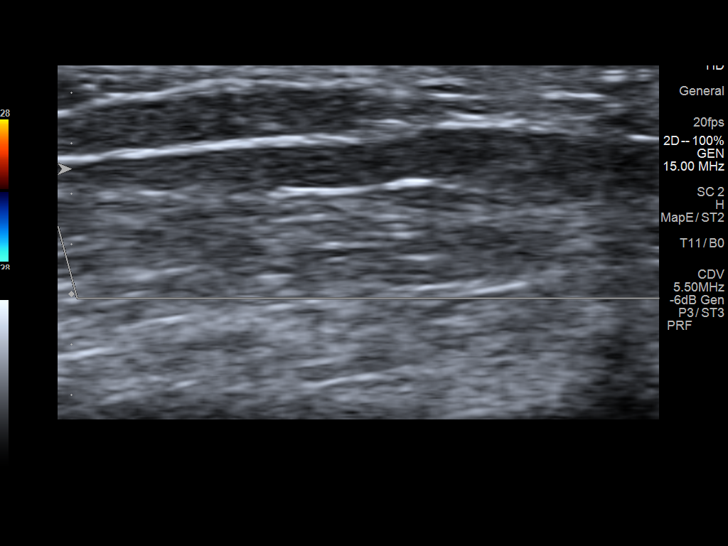
[im 10/15]
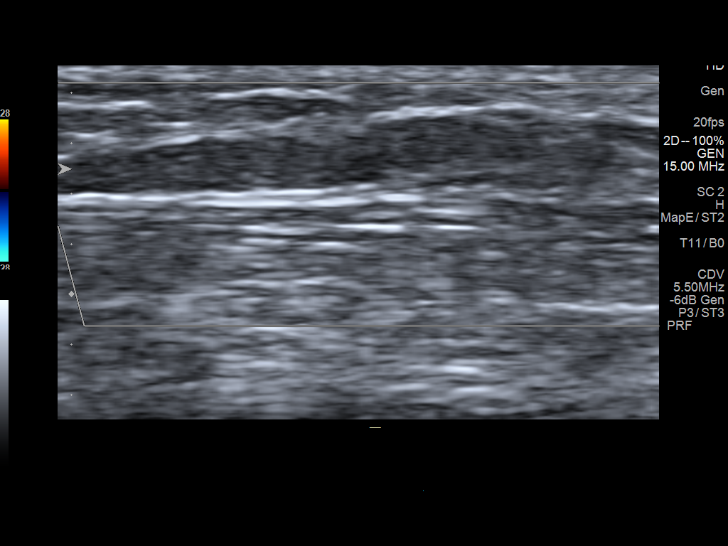
[im 11/15]
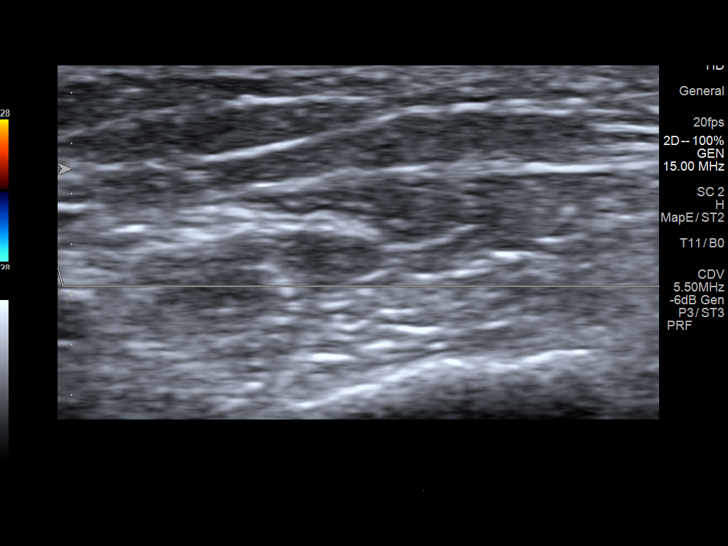
[im 12/15]
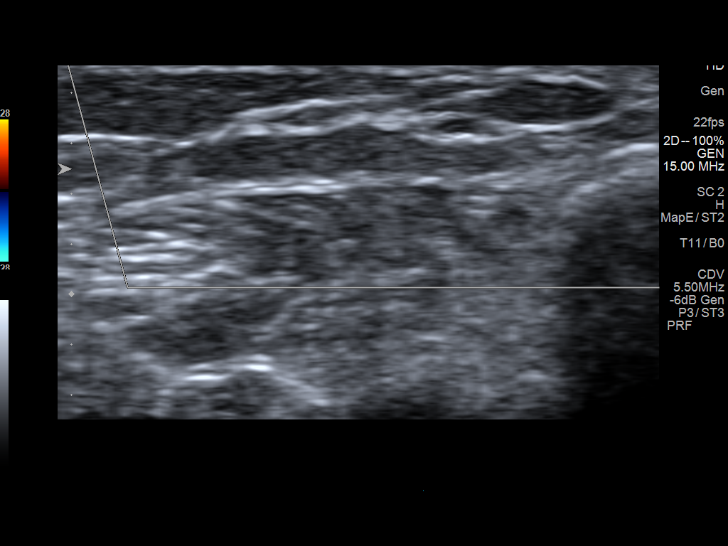
[im 13/15]
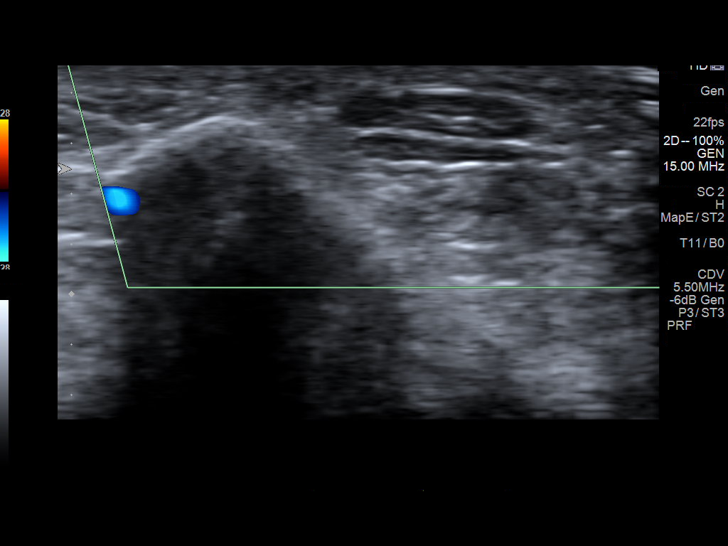
[im 14/15]
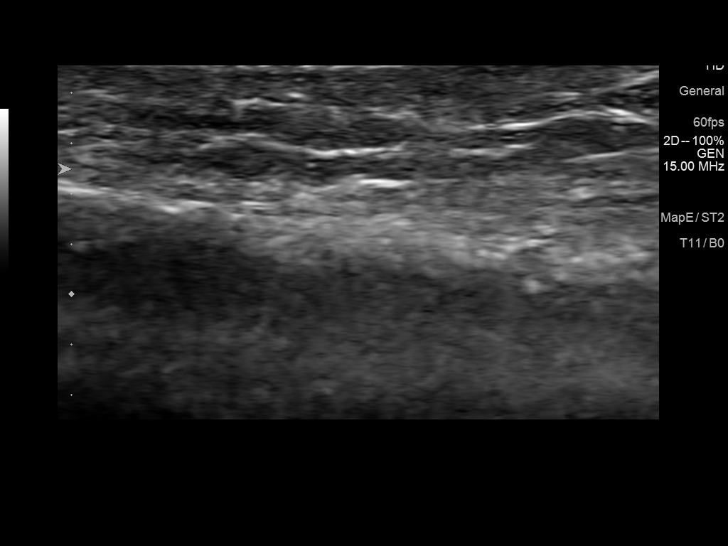
[im 15/15]
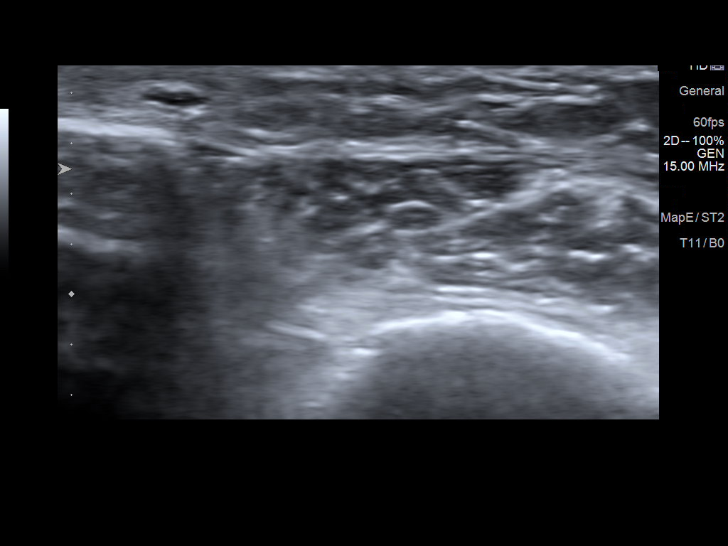

[14 of 15 positions shown; findings below may reference images not displayed]

FINDINGS: Sonographic evaluation of the palpable area along the lateral aspect
of the left ankle was performed. There is no distinct abnormality
within the lateral aspect of the left ankle to correspond to the
patient's reported mass. Normal subcutaneous fat is identified,
without significant difference in comparison to the right ankle. No
fluid collection. No abnormal vascularity.
IMPRESSION: 1. No sonographic abnormality to correspond with the palpable area
along the lateral left ankle.

## 2022-02-17 IMAGING — US US EXTREM LOW VENOUS*L*
1 series · 13 of 24 positions shown · non-contrast
Comparison: None.

CLINICAL DATA: Left ankle fullness.  Evaluate for DVT.



[Series 1: us extrem low venous*left* · 0.05mm/px · 13 of 39 slices shown]
[im 1/39]
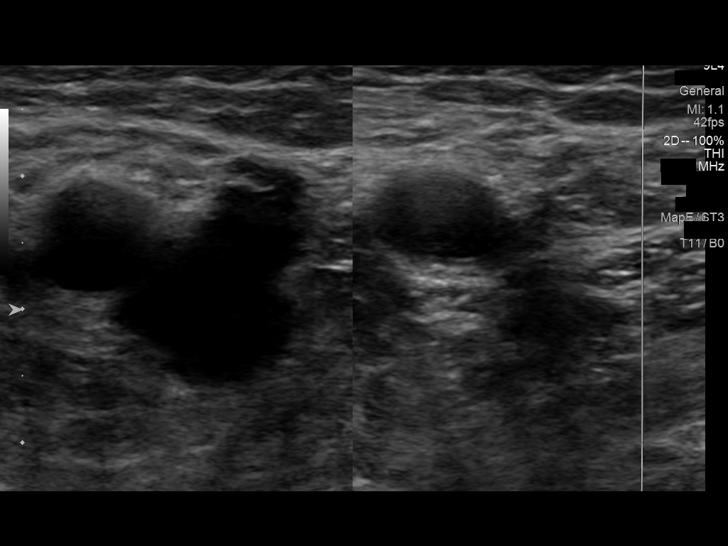
[im 4/39]
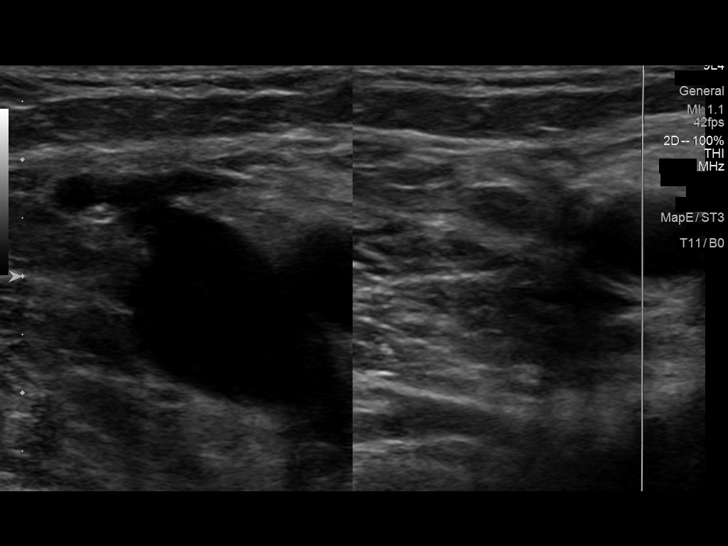
[im 7/39]
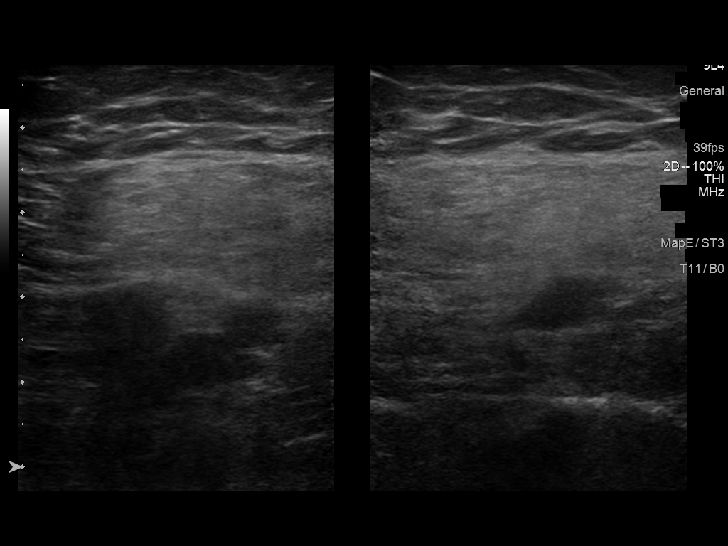
[im 10/39]
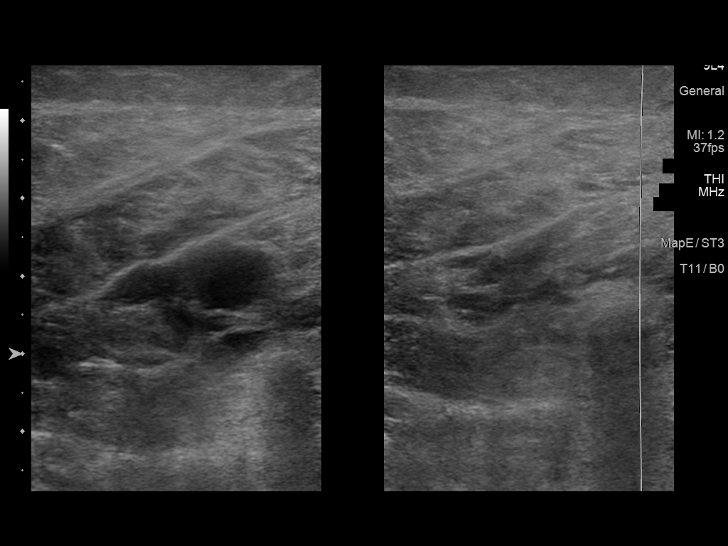
[im 14/39]
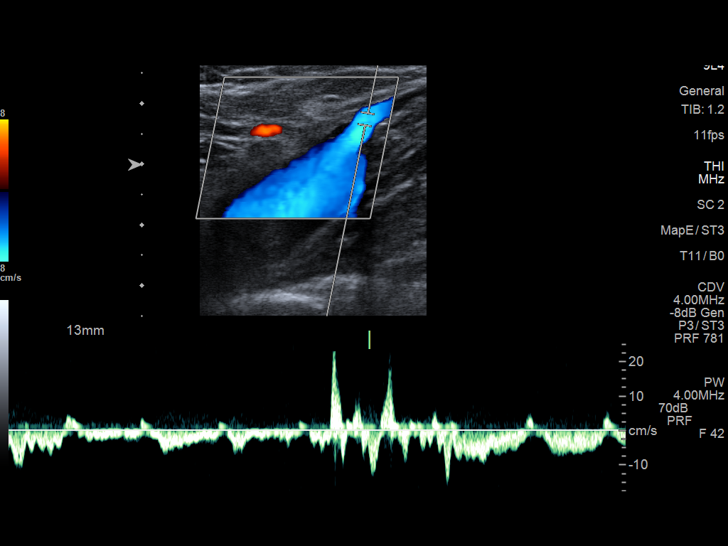
[im 17/39]
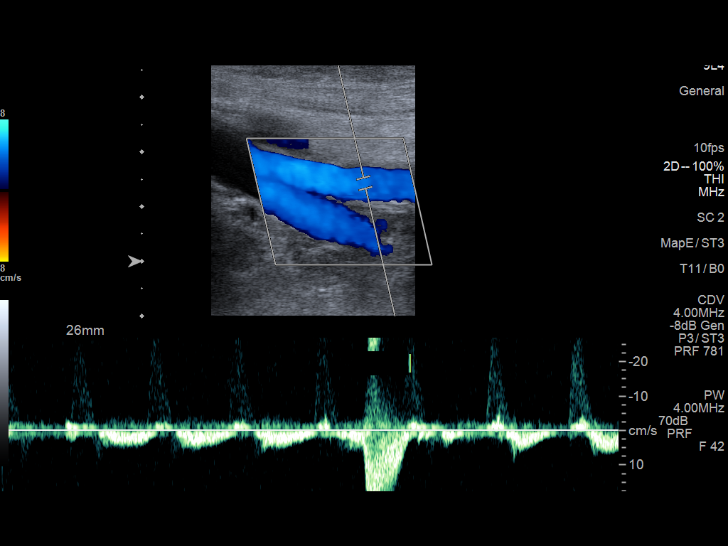
[im 20/39]
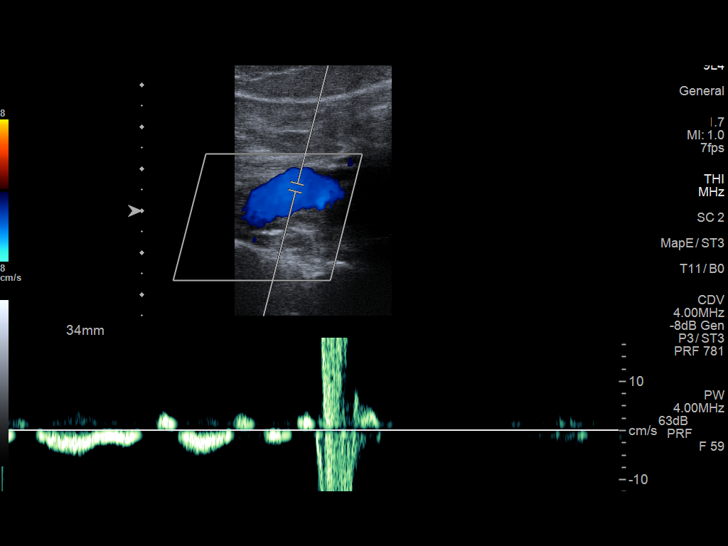
[im 22/39]
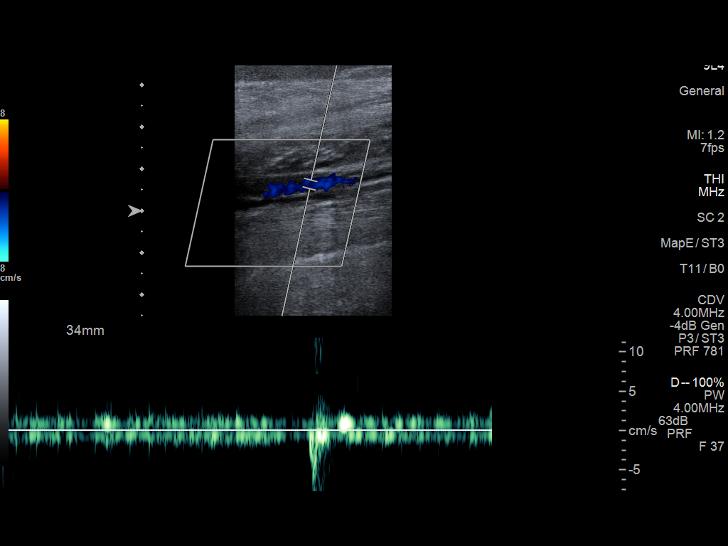
[im 25/39]
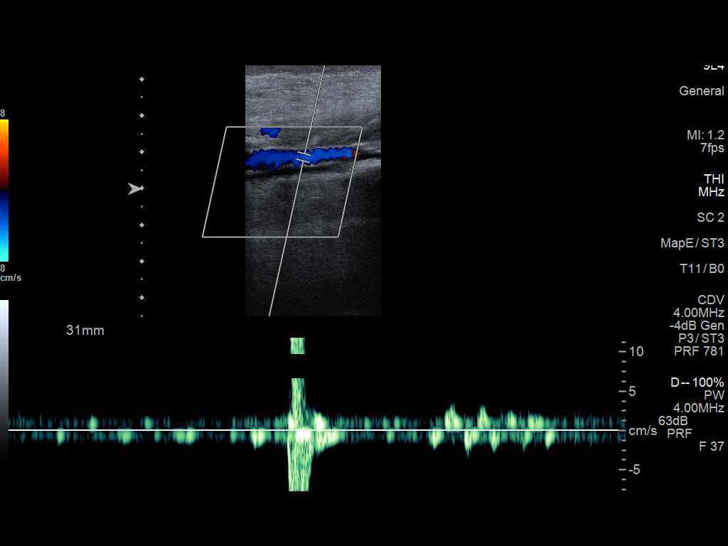
[im 29/39]
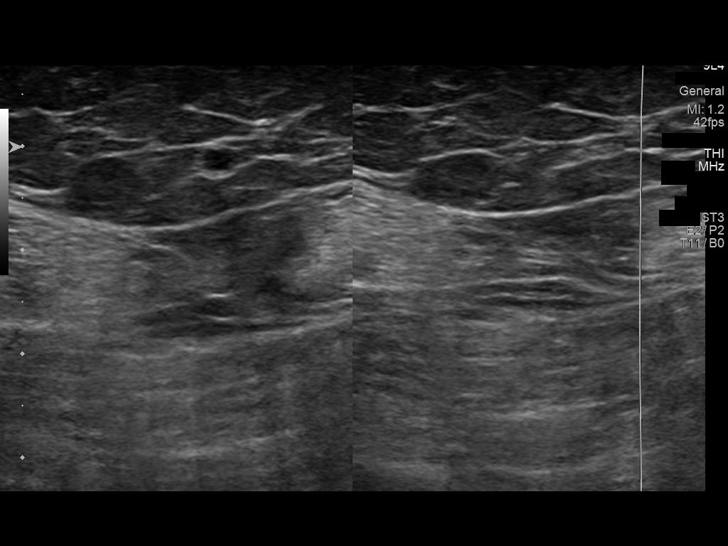
[im 32/39]
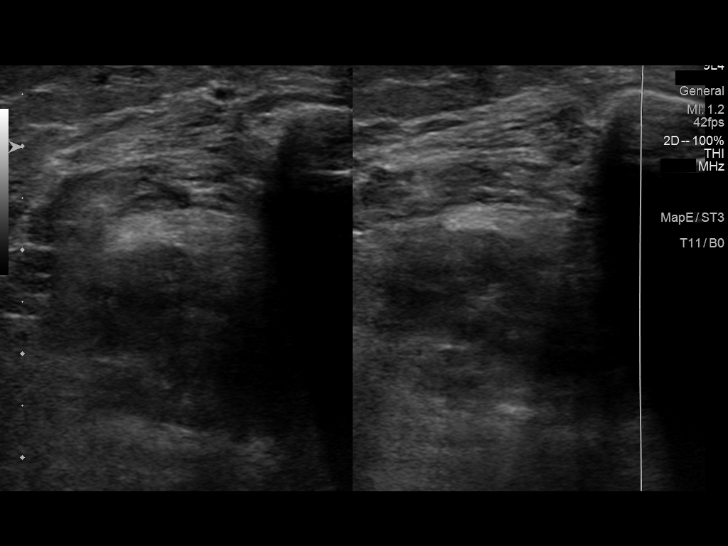
[im 35/39]
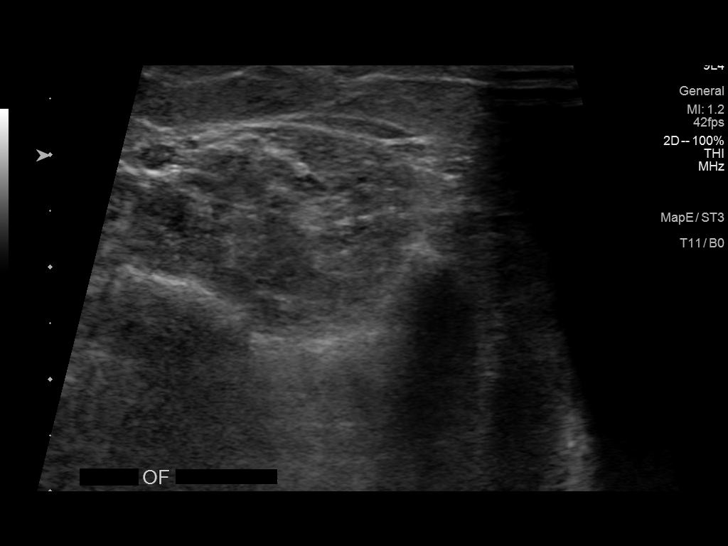
[im 39/39]
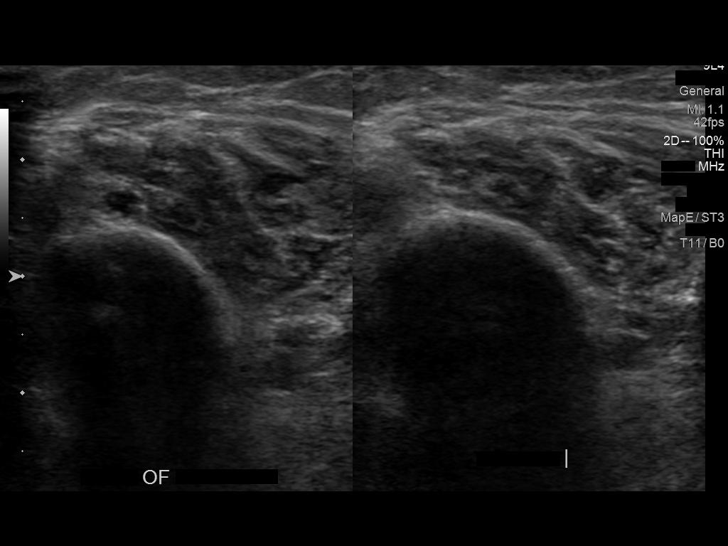

[13 of 24 positions shown; findings below may reference images not displayed]

FINDINGS: Contralateral Common Femoral Vein: Respiratory phasicity is normal
and symmetric with the symptomatic side. No evidence of thrombus.
Normal compressibility.

Common Femoral Vein: No evidence of thrombus. Normal
compressibility, respiratory phasicity and response to augmentation.

Saphenofemoral Junction: No evidence of thrombus. Normal
compressibility and flow on color Doppler imaging.

Profunda Femoral Vein: No evidence of thrombus. Normal
compressibility and flow on color Doppler imaging.

Femoral Vein: No evidence of thrombus. Normal compressibility,
respiratory phasicity and response to augmentation.

Popliteal Vein: No evidence of thrombus. Normal compressibility,
respiratory phasicity and response to augmentation.

Calf Veins: No evidence of thrombus. Normal compressibility and flow
on color Doppler imaging.

Superficial Great Saphenous Vein: No evidence of thrombus. Normal
compressibility.

Other Findings: Sonographic evaluation of patient's area of fullness
involving the lateral aspect the left lower leg is without
sonographic correlate (images 35 through 40). Specifically, no
definable/drainable fluid collection.
IMPRESSION: 1. No evidence of DVT within the left lower extremity.
2. No sonographic correlate for patient's area of fullness involving
the lateral aspect of the left lower leg.

## 2022-05-09 ENCOUNTER — Other Ambulatory Visit: Payer: Self-pay | Admitting: Family Medicine

## 2022-05-09 DIAGNOSIS — I10 Essential (primary) hypertension: Secondary | ICD-10-CM

## 2022-05-31 ENCOUNTER — Encounter: Payer: Self-pay | Admitting: Gastroenterology

## 2022-05-31 ENCOUNTER — Ambulatory Visit (INDEPENDENT_AMBULATORY_CARE_PROVIDER_SITE_OTHER): Payer: Medicare Other | Admitting: Gastroenterology

## 2022-05-31 ENCOUNTER — Telehealth: Payer: Self-pay

## 2022-05-31 VITALS — BP 124/76 | HR 85 | Ht 62.0 in | Wt 156.0 lb

## 2022-05-31 DIAGNOSIS — R194 Change in bowel habit: Secondary | ICD-10-CM | POA: Diagnosis not present

## 2022-05-31 DIAGNOSIS — K921 Melena: Secondary | ICD-10-CM

## 2022-05-31 MED ORDER — DICYCLOMINE HCL 10 MG PO CAPS: 10.0000 mg | ORAL_CAPSULE | Freq: Three times a day (TID) | ORAL | 11 refills | Status: DC

## 2022-05-31 NOTE — Telephone Encounter (Signed)
PA request received via CMM for Dicyclomine HCl  capsules  PA has been submitted to [facility] and is pending additional questions/determination  Key: [id number]

## 2022-05-31 NOTE — Progress Notes (Signed)
    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  Contact us if symptoms do not significantly improve   HPI    This is a 72 year old female with small-volume rectal bleeding and a change in bowel habits.  She notes urgent diarrhea following some meals and intermittent constipation. She occasionally noted mild LLQ cramping with BMs. She underwent colonoscopy in August 2023 for change in bowel habits which was normal.  No repeat screening colonoscopy was planned due to age and absence of polyps. Denies weight loss, constipation, change in stool caliber, melena, nausea, vomiting, dysphagia, reflux symptoms, chest pain.    Labs / Imaging       Latest Ref Rng & Units 08/27/2021   10:20 AM 03/26/2020    9:50 AM 08/01/2018    8:37 AM  Hepatic Function  Total Protein 6.0 - 8.3 g/dL 6.9  7.3  6.9   Albumin 3.5 - 5.2 g/dL 4.2  4.5  4.5   AST 0 - 37 U/L ALT 0 - 35 U/L Alk Phosphatase 39 - 117 U/L 78  86  79   Total Bilirubin 0.2 - 1.2 mg/dL 0.6  0.6  0.5        Latest Ref Rng & Units 08/27/2021   10:20 AM 03/26/2020    9:50 AM 12/30/2014    1:11 PM  CBC  WBC 4.0 - 10.5 K/uL 3.9  3.6  3.7   Hemoglobin 12.0 - 15.0 g/dL 82.9  56.2  13.0   Hematocrit 36.0 - 46.0 % 38.3  40.4  38.4   Platelets 150.0 - 400.0 K/uL 177.0  194     Current Medications, Allergies, Past Medical History, Past Surgical History, Family History and Social History were reviewed in [employer] record.   Physical Exam: General: Well developed, well nourished, no acute distress Head: Normocephalic and atraumatic Eyes: Sclerae anicteric, EOMI Ears: Normal auditory acuity Mouth: No deformities or lesions noted Lungs: Clear throughout to auscultation Heart: Regular rate and rhythm; No murmurs, rubs or  bruits Abdomen: Soft, non tender and non distended. No masses, hepatosplenomegaly or hernias noted. Normal Bowel sounds Rectal: No lesions, no tenderness, normal sphincter tone Musculoskeletal: Symmetrical with no gross deformities  Pulses:  Normal pulses noted Extremities: No edema or deformities noted Neurological: Alert oriented x 4, grossly nonfocal Psychological:  Alert and cooperative. Normal mood and affect  Anoscopy: Small internal hemorrhoids, otherwise normal  Murna Backer T. Russella Dar, MD 05/31/2022, 10:11 AM

## 2022-05-31 NOTE — Telephone Encounter (Signed)
PA has been APPROVED from 05/31/2022-05/31/2023

## 2022-05-31 NOTE — Patient Instructions (Signed)
We have sent the following medications to your pharmacy for you to pick up at your convenience: dicyclomine.   Purchase over the counter preparation H suppositories to use daily x 5 days for hemorrhoidal symptoms.   The [facility] providers would like to encourage you to use [facility] to communicate with providers for non-urgent requests or questions.  Due to long hold times on the telephone, sending your provider a message by [facility] may be a faster and more efficient way to get a response.  Please allow 48 business hours for a response.  Please remember that this is for non-urgent requests.   Thank you for choosing me and [facility].  Venita Lick. Pleas Koch., MD., Clementeen Graham

## 2023-02-14 ENCOUNTER — Other Ambulatory Visit: Payer: Self-pay | Admitting: Family Medicine

## 2023-02-14 ENCOUNTER — Ambulatory Visit: Payer: Self-pay | Admitting: Family Medicine

## 2023-02-14 DIAGNOSIS — I10 Essential (primary) hypertension: Secondary | ICD-10-CM

## 2023-02-14 MED ORDER — VALSARTAN 80 MG PO TABS: 80.0000 mg | ORAL_TABLET | Freq: Every day | ORAL | 0 refills | Status: DC

## 2023-02-14 NOTE — Telephone Encounter (Signed)
 Copied from CRM [id number]. Topic: Clinical - Medication Refill >> Feb 14, 2023 10:59 AM Viola FALCON wrote: Most Recent Primary Care Visit:  Provider: GREER, JULIE M  Department: [facility]  Visit Type: MEDICARE WELL VISIT  Date: 08/06/2021  Medication: Bad cough hoping for z pac   Has the patient contacted their pharmacy? Yes (Agent: If no, request that the patient contact the pharmacy for the refill. If patient does not wish to contact the pharmacy document the reason why and proceed with request.) (Agent: If yes, when and what did the pharmacy advise?)  Is this the correct pharmacy for this prescription? Yes If no, delete pharmacy and type the correct one.  This is the patient's preferred pharmacy:   [id number] - [place], [place] - [address] AT [facility] [address] [place] [address] Phone: [phone] Fax: [phone]   Has the prescription been filled recently? No  Is the patient out of the medication? Yes  Has the patient been seen for an appointment in the last year OR does the patient have an upcoming appointment? Yes  Can we respond through MyChart? Yes  Agent: Please be advised that Rx refills may take up to 3 business days. We ask that you follow-up with your pharmacy.    Chief Complaint: Cough Symptoms: Cough, rhinorrhea, eye irritation  Frequency: Frequent cough Pertinent Negatives: Patient denies fever, difficulty breathing  Disposition: [] ED /[] Urgent Care (no appt availability in office) / [x] Appointment(In office/virtual)/ []  [facility]/ [] Home Care/ [] Refused Recommended Disposition /[]  Mobile Bus/ []  Follow-up with PCP Additional Notes: Patient reports a cough that began 2-3 days ago. She states that the cough is productive of clear sputum. She states that her cough causes her to have coughing spells that she reports are severe. She states that with her cough she has been  experiencing rhinorrhea and eye irritation. She denies any fevers or shortness of breath. Patient has been taking Dayquil and Nyquil without relief. Appointment made for patient tomorrow in the office. Patient understood and is agreeable with this plan.      Reason for Disposition  SEVERE coughing spells (e.g., whooping sound after coughing, vomiting after coughing)  Answer Assessment - Initial Assessment Questions 1. ONSET: When did the cough begin?      2-3 days ago  2. SEVERITY: How bad is the cough today?      Mild now but I get into coughing fits  3. SPUTUM: Describe the color of your sputum (none, dry cough; clear, white, yellow, green)     Clear 4. HEMOPTYSIS: Are you coughing up any blood? If so ask: How much? (flecks, streaks, tablespoons, etc.)     No 5. DIFFICULTY BREATHING: Are you having difficulty breathing? If Yes, ask: How bad is it? (e.g., mild, moderate, severe)    - MILD: No SOB at rest, mild SOB with walking, speaks normally in sentences, can lie down, no retractions, pulse < 100.    - MODERATE: SOB at rest, SOB with minimal exertion and prefers to sit, cannot lie down flat, speaks in phrases, mild retractions, audible wheezing, pulse 100-120.    - SEVERE: Very SOB at rest, speaks in single words, struggling to breathe, sitting hunched forward, retractions, pulse > 120      No 6. FEVER: Do you have a fever? If Yes, ask: What is your temperature, how was it measured, and when did it start?     No 7. CARDIAC HISTORY:  Do you have any history of heart disease? (e.g., heart attack, congestive heart failure)      Takes Valsartan   8. LUNG HISTORY: Do you have any history of lung disease?  (e.g., pulmonary embolus, asthma, emphysema)     No 9. PE RISK FACTORS: Do you have a history of blood clots? (or: recent major surgery, recent prolonged travel, bedridden)     No 10. OTHER SYMPTOMS: Do you have any other symptoms? (e.g., runny nose, wheezing,  chest pain)       Runny nose, sneezing, eye irritation  11. PREGNANCY: Is there any chance you are pregnant? When was your last menstrual period?       No 12. TRAVEL: Have you traveled out of the country in the last month? (e.g., travel history, exposures)       No  Protocols used: Cough - Acute Non-Productive-A-AH

## 2023-02-14 NOTE — Addendum Note (Signed)
 Addended by: Meredith Staggers R on: 02/14/2023 05:17 PM   Modules accepted: Orders

## 2023-02-14 NOTE — Telephone Encounter (Addendum)
 Noted in office appointment scheduled tomorrow for cough.   Regarding valsartan , I have not seen patient since 2022.  Appointment tomorrow with Padonda Webb for acute issue.  Will also need appointment with me to review hypertension, chronic conditions.  30-day supply of valsartan   provided in the interim.

## 2023-02-14 NOTE — Telephone Encounter (Signed)
 Copied from CRM [id number]. Topic: Clinical - Medication Refill >> Feb 14, 2023 11:01 AM Viola FALCON wrote: Most Recent Primary Care Visit:  Provider: GREER, JULIE M  Department: [facility]  Visit Type: MEDICARE WELL VISIT  Date: 08/06/2021  Medication: valsartan  (DIOVAN ) 80 MG tablet  Has the patient contacted their pharmacy? No (Agent: If no, request that the patient contact the pharmacy for the refill. If patient does not wish to contact the pharmacy document the reason why and proceed with request.) (Agent: If yes, when and what did the pharmacy advise?)  Is this the correct pharmacy for this prescription? Yes If no, delete pharmacy and type the correct one.  This is the patient's preferred pharmacy:   [id number] - [place], [place] - [address] AT [facility] [address]  [place] [address] Phone: [phone] Fax: [phone]   Has the prescription been filled recently? Yes  Is the patient out of the medication? No  Has the patient been seen for an appointment in the last year OR does the patient have an upcoming appointment? Yes  Can we respond through MyChart? Yes  Agent: Please be advised that Rx refills may take up to 3 business days. We ask that you follow-up with your pharmacy.

## 2023-02-14 NOTE — Telephone Encounter (Signed)
 Copied from CRM [id number]. Topic: Clinical - Medication Refill >> Feb 14, 2023 11:01 AM Viola FALCON wrote: Most Recent Primary Care Visit:  Provider: GREER, JULIE M  Department: [facility]  Visit Type: MEDICARE WELL VISIT  Date: 08/06/2021  Medication: ***  Has the patient contacted their pharmacy?  (Agent: If no, request that the patient contact the pharmacy for the refill. If patient does not wish to contact the pharmacy document the reason why and proceed with request.) (Agent: If yes, when and what did the pharmacy advise?)  Is this the correct pharmacy for this prescription?  If no, delete pharmacy and type the correct one.  This is the patient's preferred pharmacy:  [id number] - [place], [place] - [address] AT [facility] [address] [place] [address] Phone: [phone] Fax: [phone]   Has the prescription been filled recently?   Is the patient out of the medication?   Has the patient been seen for an appointment in the last year OR does the patient have an upcoming appointment?   Can we respond through MyChart?   Agent: Please be advised that Rx refills may take up to 3 business days. We ask that you follow-up with your pharmacy.

## 2023-02-15 ENCOUNTER — Ambulatory Visit (INDEPENDENT_AMBULATORY_CARE_PROVIDER_SITE_OTHER): Payer: Medicare Other | Admitting: Family

## 2023-02-15 VITALS — BP 126/70 | HR 79 | Temp 98.6°F | Ht 62.0 in | Wt 159.8 lb

## 2023-02-15 DIAGNOSIS — R051 Acute cough: Secondary | ICD-10-CM | POA: Diagnosis not present

## 2023-02-15 DIAGNOSIS — J069 Acute upper respiratory infection, unspecified: Secondary | ICD-10-CM

## 2023-02-15 LAB — POC COVID19 BINAXNOW: SARS Coronavirus 2 Ag: NEGATIVE

## 2023-02-15 LAB — POCT INFLUENZA A/B
Influenza A, POC: NEGATIVE
Influenza B, POC: NEGATIVE

## 2023-02-15 MED ORDER — DOXYCYCLINE HYCLATE 100 MG PO TABS: 100.0000 mg | ORAL_TABLET | Freq: Two times a day (BID) | ORAL | 0 refills | Status: DC

## 2023-02-15 MED ORDER — METHYLPREDNISOLONE 4 MG PO TBPK: ORAL_TABLET | ORAL | 0 refills | Status: DC

## 2023-02-15 MED ORDER — HYDROCOD POLI-CHLORPHE POLI ER 10-8 MG/5ML PO SUER: 5.0000 mL | Freq: Two times a day (BID) | ORAL | 0 refills | Status: DC | PRN

## 2023-02-15 NOTE — Progress Notes (Signed)
 Acute Office Visit  Subjective:     Patient ID: Kathleen Douglas, female    DOB: August 24, 1950, 73 y.o.   MRN: [id number]  Chief Complaint  Patient presents with  . Sore Throat    Pt notes Thursday last week started feeling unwell, notes congestion, sinus drainage, denied fever and chills     HPI Patient is in today with c/o cough, congestion, drainage, fever and chills. Has been taking Nyquil and Dayquil, drinking tea and more water. 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   Review of Systems  Constitutional:  Positive for chills, fever and malaise/fatigue.  HENT:  Positive for congestion and sore throat.   Respiratory:  Positive for cough.   Musculoskeletal: Negative.   Neurological: Negative.   Endo/Heme/Allergies: Negative.   Psychiatric/Behavioral: Negative.    Past Medical History:  Diagnosis Date  . Cough 10/31/2018  . HLD (hyperlipidemia)   . HTN (hypertension)   . Osteopenia   . Seasonal allergies   . Vitamin D  deficiency     Social History   Socioeconomic History  . Marital status: Married    Spouse name: Not on file  . Number of children: 1  . Years of education: Not on file  . Highest education level: Not on file  Occupational History  . Occupation: Probation officer  Tobacco Use  . Smoking status: Never  . Smokeless tobacco: Never  Vaping Use  . Vaping status: Never Used  Substance and Sexual Activity  . Alcohol use: Yes    Comment: socially, occ.  . Drug use: No  . Sexual activity: Yes    Birth control/protection: None  Other Topics Concern  . Not on file  Social History Narrative  . Not on file   Social Drivers of Health   Financial Resource Strain: Low Risk  (02/14/2023)   Overall Financial Resource Strain (CARDIA)   . Difficulty of Paying Living Expenses: Not hard at all  Food Insecurity: No Food Insecurity (02/14/2023)   Hunger Vital Sign   . Worried About Programme Researcher, Broadcasting/film/video in the Last Year: Never true   . Ran  Out of Food in the Last Year: Never true  Transportation Needs: Unknown (02/14/2023)   PRAPARE - Transportation   . Lack of Transportation (Medical): Not on file   . Lack of Transportation (Non-Medical): No  Physical Activity: Insufficiently Active (02/14/2023)   Exercise Vital Sign   . Days of Exercise per Week: 4 days   . Minutes of Exercise per Session: 30 min  Stress: No Stress Concern Present (02/14/2023)   [employer] of Occupational Health - Occupational Stress Questionnaire   . Feeling of Stress : Not at all  Social Connections: Socially Integrated (02/14/2023)   Social Connection and Isolation Panel [NHANES]   . Frequency of Communication with Friends and Family: More than three times a week   . Frequency of Social Gatherings with Friends and Family: Three times a week   . Attends Religious Services: More than 4 times per year   . Active Member of Clubs or Organizations: Yes   . Attends Banker Meetings: 1 to 4 times per year   . Marital Status: Married  Catering Manager Violence: Not At Risk (08/06/2021)   Humiliation, Afraid, Rape, and Kick questionnaire   . Fear of Current or Ex-Partner: No   . Emotionally Abused: No   . Physically Abused: No   . Sexually Abused: No    Past  Surgical History:  Procedure Laterality Date  . ABDOMINAL HYSTERECTOMY    . BREAST BIOPSY Left    Excisional   . COLONOSCOPY  2004   Dr.Martin Vicci  . TUBAL LIGATION      Family History  Problem Relation Age of Onset  . Lymphoma Mother   . Bladder Cancer Father   . Hypertension Father   . Glaucoma Father   . Hypertension Sister   . Colon cancer Maternal Grandmother 19  . Diabetes Maternal Grandfather   . Breast cancer Paternal Grandmother   . Hypertension Daughter   . Breast cancer Paternal Aunt        x 2  . Esophageal cancer Neg Hx   . Rectal cancer Neg Hx   . Stomach cancer Neg Hx   . Colon polyps Neg Hx     No Known Allergies  Current Outpatient Medications on  File Prior to Visit  Medication Sig Dispense Refill  . Calcium  Carbonate-Vitamin D  (CALCIUM  + D PO) Take 2 tablets by mouth every evening. Reported on 06/05/2015    . Multiple Vitamin (MULTIVITAMIN) tablet Take 1 tablet by mouth daily.    . valsartan  (DIOVAN ) 80 MG tablet Take 1 tablet (80 mg total) by mouth daily. 30 tablet 0  . [DISCONTINUED] loratadine  (CLARITIN ) 10 MG tablet Take 1 tablet (10 mg total) by mouth daily. (Patient not taking: No sig reported) 30 tablet 11   No current facility-administered medications on file prior to visit.    BP 126/70   Pulse 79   Temp 98.6 F (37 C) (Temporal)   Ht 5' 2 (1.575 m)   Wt 159 lb 12.8 oz (72.5 kg)   SpO2 95%   BMI 29.23 kg/m chart      Objective:    BP 126/70   Pulse 79   Temp 98.6 F (37 C) (Temporal)   Ht 5' 2 (1.575 m)   Wt 159 lb 12.8 oz (72.5 kg)   SpO2 95%   BMI 29.23 kg/m    Physical Exam Vitals reviewed.  Constitutional:      Appearance: She is well-developed and normal weight.  HENT:     Right Ear: Tympanic membrane and ear canal normal.     Left Ear: Tympanic membrane normal.     Mouth/Throat:     Mouth: Mucous membranes are moist.  Cardiovascular:     Rate and Rhythm: Normal rate and regular rhythm.     Heart sounds: Normal heart sounds.  Pulmonary:     Effort: Pulmonary effort is normal.     Breath sounds: Normal breath sounds.  Musculoskeletal:     Cervical back: Normal range of motion and neck supple.  Skin:    General: Skin is warm and dry.  Neurological:     General: No focal deficit present.     Mental Status: She is alert and oriented to person, place, and time.  Psychiatric:        Mood and Affect: Mood normal.        Behavior: Behavior normal.   Results for orders placed or performed in visit on 02/15/23  POC COVID-19  Result Value Ref Range   SARS Coronavirus 2 Ag Negative Negative        Assessment & Plan:   Problem List Items Addressed This Visit     Cough - Primary  (Chronic)   Relevant Orders   POC COVID-19 (Completed)   POCT Influenza A/B   Other Visit Diagnoses  Viral upper respiratory illness           Meds ordered this encounter  Medications  . methylPREDNISolone  (MEDROL  DOSEPAK) 4 MG TBPK tablet    Sig: As directed    Dispense:  21 tablet    Refill:  0  . chlorpheniramine-HYDROcodone  (TUSSIONEX) 10-8 MG/5ML    Sig: Take 5 mLs by mouth every 12 (twelve) hours as needed for cough.    Dispense:  70 mL    Refill:  0  . doxycycline  (VIBRA -TABS) 100 MG tablet    Sig: Take 1 tablet (100 mg total) by mouth 2 (two) times daily.    Dispense:  14 tablet    Refill:  0   Call the office if symptoms worsen or persist.   No follow-ups on file.  Dorcus Riga B Kathren Scearce, FNP

## 2023-02-23 ENCOUNTER — Encounter: Payer: Self-pay | Admitting: Family Medicine

## 2023-02-23 ENCOUNTER — Ambulatory Visit: Payer: Medicare Other | Admitting: Family Medicine

## 2023-02-23 VITALS — BP 136/70 | HR 67 | Temp 98.0°F | Ht 62.0 in | Wt 157.8 lb

## 2023-02-23 DIAGNOSIS — I10 Essential (primary) hypertension: Secondary | ICD-10-CM | POA: Diagnosis not present

## 2023-02-23 DIAGNOSIS — Z23 Encounter for immunization: Secondary | ICD-10-CM

## 2023-02-23 DIAGNOSIS — Z9189 Other specified personal risk factors, not elsewhere classified: Secondary | ICD-10-CM

## 2023-02-23 DIAGNOSIS — E785 Hyperlipidemia, unspecified: Secondary | ICD-10-CM | POA: Diagnosis not present

## 2023-02-23 DIAGNOSIS — J069 Acute upper respiratory infection, unspecified: Secondary | ICD-10-CM | POA: Diagnosis not present

## 2023-02-23 LAB — LIPID PANEL
Cholesterol: 270 mg/dL — ABNORMAL HIGH (ref 0–200)
HDL: 85.8 mg/dL (ref >39.00)
LDL Cholesterol: 155 mg/dL — ABNORMAL HIGH (ref 0–99)
NonHDL: 184.36
Total CHOL/HDL Ratio: 3
Triglycerides: 149 mg/dL (ref 0.0–149.0)
VLDL: 29.8 mg/dL (ref 0.0–40.0)

## 2023-02-23 LAB — COMPREHENSIVE METABOLIC PANEL
ALT: 18 U/L (ref 0–35)
AST: 22 U/L (ref 0–37)
Albumin: 4.5 g/dL (ref 3.5–5.2)
Alkaline Phosphatase: 90 U/L (ref 39–117)
BUN: 21 mg/dL (ref 6–23)
CO2: 32 meq/L (ref 19–32)
Calcium: 10.3 mg/dL (ref 8.4–10.5)
Chloride: 98 meq/L (ref 96–112)
Creatinine, Ser: 0.72 mg/dL (ref 0.40–1.20)
GFR: 82.97 mL/min (ref >60.00)
Glucose, Bld: 73 mg/dL (ref 70–99)
Potassium: 4 meq/L (ref 3.5–5.1)
Sodium: 139 meq/L (ref 135–145)
Total Bilirubin: 0.7 mg/dL (ref 0.2–1.2)
Total Protein: 7.7 g/dL (ref 6.0–8.3)

## 2023-02-23 MED ORDER — VALSARTAN 80 MG PO TABS: 80.0000 mg | ORAL_TABLET | Freq: Every day | ORAL | 2 refills | Status: DC

## 2023-02-23 NOTE — Patient Instructions (Addendum)
 No med changes today. If any lab concerns I will let you know.  Flu vaccine today. Prevnar 20 (updated pneumonia vaccine) given today.   Shingles, RSV and Tdap at your pharmacy - tdap not due until August, though.  Covid booster at pharmacy also an option.  Schedule mammogram when possible.   Take care!

## 2023-02-23 NOTE — Progress Notes (Signed)
 Subjective:  Patient ID: Kathleen Douglas, female    DOB: [birth year]  Age: 73 y.o. MRN: [id number]  CC:  Chief Complaint  Patient presents with   Medical Management of Chronic Issues    Pt would like a pneumonia vaccine today, and to discuss vaccine schedule today knows she can have them administered at the pharmacy due to cost but would like to discuss the order     HPI Kathleen Douglas presents for   Follow-up of chronic conditions and follow-up cough.  Cough Acute visit noted on January 7.  Diagnosed with viral URI, treated with Medrol  Dosepak, Tussionex, doxycycline .  Finished abx and prednisone . Feeling better. Minimal cough that has improved, no fever/dyspnea.  Has cough syrup but not using now.   Hypertension: Valsartan  80 mg daily. No new side effects. New rx recently.  Home readings: none recently. Has BP cuff.  BP Readings from Last 3 Encounters:  02/23/23 136/70  02/15/23 126/70  05/31/22 124/76   Lab Results  Component Value Date   CREATININE 0.78 08/27/2021   Hyperlipidemia: Mild elevation in past. No meds. Some room for improvement on exercise. Watching diet.   Lab Results  Component Value Date   CHOL 239 (H) 03/26/2020   HDL 108 03/26/2020   LDLCALC 120 (H) 03/26/2020   TRIG 67 03/26/2020   CHOLHDL 2.2 03/26/2020   Lab Results  Component Value Date   ALT 22 08/27/2021   AST 21 08/27/2021   ALKPHOS 78 08/27/2021   BILITOT 0.6 08/27/2021      Health maintenance, vaccine review: New grandchild expected March 13th, she and her spouse have been looking at vaccines to make sure they are up-to-date with recommendations.  Has talked with friend about vaccines. Shingles, pna, flu vaccine, tdap, rsv. Friend told her with shingles then flu, tdap, pna few weeks later.  Mammogram - due. She will schedule.   Immunization History  Administered Date(s) Administered   Fluad Quad(high Dose 65+) 03/26/2020, 12/01/2020   Influenza Split 02/21/2012    Influenza, High Dose Seasonal PF 11/30/2017   Influenza,inj,Quad PF,6+ Mos 12/30/2014, 03/03/2016   PFIZER Comirnaty(Gray Top)Covid-19 Tri-Sucrose Vaccine 03/07/2019, 03/28/2019   PFIZER(Purple Top)SARS-COV-2 Vaccination 04/01/2020   Pneumococcal Conjugate-13 02/18/2016   Pneumococcal Polysaccharide-23 02/21/2012   Tdap 09/22/2013    History Patient Active Problem List   Diagnosis Date Noted   Cough 10/31/2018   Dyslipidemia 03/03/2016   Vitamin D  deficiency 03/03/2016   Past Medical History:  Diagnosis Date   Cough 10/31/2018   HLD (hyperlipidemia)    HTN (hypertension)    Osteopenia    Seasonal allergies    Vitamin D  deficiency    Past Surgical History:  Procedure Laterality Date   ABDOMINAL HYSTERECTOMY     BREAST BIOPSY Left    Excisional    COLONOSCOPY  2004   Dr.Martin Johnson   TUBAL LIGATION     No Known Allergies Prior to Admission medications   Medication Sig Start Date End Date Taking? Authorizing Provider  Calcium  Carbonate-Vitamin D  (CALCIUM  + D PO) Take 2 tablets by mouth every evening. Reported on 06/05/2015   Yes [provider]  chlorpheniramine-HYDROcodone  (TUSSIONEX) 10-8 MG/5ML Take 5 mLs by mouth every 12 (twelve) hours as needed for cough. 02/15/23  Yes Webb, Padonda B, FNP  doxycycline  (VIBRA -TABS) 100 MG tablet Take 1 tablet (100 mg total) by mouth 2 (two) times daily. 02/15/23  Yes Webb, Padonda B, FNP  methylPREDNISolone  (MEDROL  DOSEPAK) 4 MG TBPK tablet As directed 02/15/23  Yes Jarold Merlin B, FNP  Multiple Vitamin (MULTIVITAMIN) tablet Take 1 tablet by mouth daily.   Yes [provider]  valsartan  (DIOVAN ) 80 MG tablet Take 1 tablet (80 mg total) by mouth daily. 02/14/23  Yes Benjiman Bras, MD  loratadine  (CLARITIN ) 10 MG tablet Take 1 tablet (10 mg total) by mouth daily. Patient not taking: No sig reported 06/05/18 01/06/20  Aquilla Knapp, MD   Social History   Socioeconomic History   Marital status: Married    Spouse  name: Not on file   Number of children: 1   Years of education: Not on file   Highest education level: Not on file  Occupational History   Occupation: Client specialist  Tobacco Use   Smoking status: Never   Smokeless tobacco: Never  Vaping Use   Vaping status: Never Used  Substance and Sexual Activity   Alcohol use: Yes    Comment: socially, occ.   Drug use: No   Sexual activity: Yes    Birth control/protection: None  Other Topics Concern   Not on file  Social History Narrative   Not on file   Social Drivers of Health   Financial Resource Strain: Low Risk  (02/14/2023)   Overall Financial Resource Strain (CARDIA)    Difficulty of Paying Living Expenses: Not hard at all  Food Insecurity: No Food Insecurity (02/14/2023)   Hunger Vital Sign    Worried About Running Out of Food in the Last Year: Never true    Ran Out of Food in the Last Year: Never true  Transportation Needs: Unknown (02/14/2023)   PRAPARE - Administrator, Civil Service (Medical): Not on file    Lack of Transportation (Non-Medical): No  Physical Activity: Insufficiently Active (02/14/2023)   Exercise Vital Sign    Days of Exercise per Week: 4 days    Minutes of Exercise per Session: 30 min  Stress: No Stress Concern Present (02/14/2023)   [employer] of Occupational Health - Occupational Stress Questionnaire    Feeling of Stress : Not at all  Social Connections: Socially Integrated (02/14/2023)   Social Connection and Isolation Panel [NHANES]    Frequency of Communication with Friends and Family: More than three times a week    Frequency of Social Gatherings with Friends and Family: Three times a week    Attends Religious Services: More than 4 times per year    Active Member of Clubs or Organizations: Yes    Attends Banker Meetings: 1 to 4 times per year    Marital Status: Married  Catering manager Violence: Not At Risk (08/06/2021)   Humiliation, Afraid, Rape, and Kick questionnaire     Fear of Current or Ex-Partner: No    Emotionally Abused: No    Physically Abused: No    Sexually Abused: No    Review of Systems  Constitutional:  Negative for fatigue and unexpected weight change.  Respiratory:  Negative for chest tightness and shortness of breath.   Cardiovascular:  Negative for chest pain, palpitations and leg swelling.  Gastrointestinal:  Negative for abdominal pain and blood in stool.  Neurological:  Negative for dizziness, syncope, light-headedness and headaches.     Objective:   Vitals:   02/23/23 0912  BP: 136/70  Pulse: 67  Temp: 98 F (36.7 C)  TempSrc: Temporal  SpO2: 96%  Weight: 157 lb 12.8 oz (71.6 kg)  Height: 5\' 2"  (1.575 m)     Physical Exam Vitals reviewed.  Constitutional:      Appearance: Normal appearance. She is well-developed.  HENT:     Head: Normocephalic and atraumatic.  Eyes:     Conjunctiva/sclera: Conjunctivae normal.     Pupils: Pupils are equal, round, and reactive to light.  Neck:     Vascular: No carotid bruit.  Cardiovascular:     Rate and Rhythm: Normal rate and regular rhythm.     Heart sounds: Normal heart sounds.  Pulmonary:     Effort: Pulmonary effort is normal.     Breath sounds: Normal breath sounds.  Abdominal:     Palpations: Abdomen is soft. There is no pulsatile mass.     Tenderness: There is no abdominal tenderness.  Musculoskeletal:     Right lower leg: No edema.     Left lower leg: No edema.  Skin:    General: Skin is warm and dry.  Neurological:     Mental Status: She is alert and oriented to person, place, and time.  Psychiatric:        Mood and Affect: Mood normal.        Behavior: Behavior normal.        Assessment & Plan:  Danaysha Barb Patridge is a 73 y.o. female . Viral upper respiratory illness  -Symptoms have improved.  Reassuring exam.  No changes for now.  RTC precautions  Essential hypertension - Plan: Comprehensive metabolic panel, valsartan  (DIOVAN ) 80 MG  tablet  -Tolerating current med regimen, continue same regimen.  Hyperlipidemia, unspecified hyperlipidemia type - Plan: Comprehensive metabolic panel, Lipid panel  -Elevated on current labs.  Option of low-dose statin or coronary calcium  scoring discussed on lab result note  Need for pneumococcal vaccination - Plan: Pneumococcal conjugate vaccine 20-valent (Prevnar 20)  Needs flu shot - Plan: Flu Vaccine Trivalent High Dose (Fluad)   Meds ordered this encounter  Medications   valsartan  (DIOVAN ) 80 MG tablet    Sig: Take 1 tablet (80 mg total) by mouth daily.    Dispense:  90 tablet    Refill:  2   Patient Instructions  No med changes today. If any lab concerns I will let you know.  Flu vaccine today. Prevnar 20 (updated pneumonia vaccine) given today.   Shingles, RSV and Tdap at your pharmacy - tdap not due until August, though.  Covid booster at pharmacy also an option.  Schedule mammogram when possible.   Take care!    Signed,   Caro Christmas, MD [facility], [facility] 02/23/23 10:21 AM

## 2023-02-24 NOTE — Telephone Encounter (Signed)
Pt is agreeable to what you suggest next

## 2023-02-24 NOTE — Telephone Encounter (Signed)
CT cardiac scoring ordered through [facility].  They should be reaching out to her for appointment.  Let me know if she does not receive a phone call from them in the next 2 weeks.

## 2023-02-24 NOTE — Addendum Note (Signed)
Addended by: Meredith Staggers R on: 02/24/2023 04:51 PM   Modules accepted: Orders

## 2023-03-08 ENCOUNTER — Ambulatory Visit (HOSPITAL_COMMUNITY)
Admission: RE | Admit: 2023-03-08 | Discharge: 2023-03-08 | Disposition: A | Discharge status: Home / Self Care | Payer: Self-pay | Source: Ambulatory Visit | Attending: Family Medicine | Admitting: Family Medicine

## 2023-03-08 DIAGNOSIS — E785 Hyperlipidemia, unspecified: Secondary | ICD-10-CM | POA: Insufficient documentation

## 2023-03-08 DIAGNOSIS — Z9189 Other specified personal risk factors, not elsewhere classified: Secondary | ICD-10-CM | POA: Insufficient documentation

## 2023-03-11 ENCOUNTER — Encounter: Payer: Self-pay | Admitting: Family Medicine

## 2023-03-21 ENCOUNTER — Emergency Department (HOSPITAL_COMMUNITY): Payer: Medicare Other

## 2023-03-21 ENCOUNTER — Emergency Department (HOSPITAL_BASED_OUTPATIENT_CLINIC_OR_DEPARTMENT_OTHER): Payer: Medicare Other

## 2023-03-21 ENCOUNTER — Ambulatory Visit (INDEPENDENT_AMBULATORY_CARE_PROVIDER_SITE_OTHER): Payer: Medicare Other | Admitting: Physician Assistant

## 2023-03-21 ENCOUNTER — Emergency Department (HOSPITAL_COMMUNITY)
Admission: EM | Admit: 2023-03-21 | Discharge: 2023-03-21 | Disposition: A | Discharge status: Home / Self Care | Payer: Medicare Other | Attending: Emergency Medicine | Admitting: Emergency Medicine

## 2023-03-21 DIAGNOSIS — M25561 Pain in right knee: Secondary | ICD-10-CM | POA: Insufficient documentation

## 2023-03-21 DIAGNOSIS — M25562 Pain in left knee: Secondary | ICD-10-CM | POA: Diagnosis not present

## 2023-03-21 DIAGNOSIS — M79662 Pain in left lower leg: Secondary | ICD-10-CM | POA: Diagnosis not present

## 2023-03-21 DIAGNOSIS — M1712 Unilateral primary osteoarthritis, left knee: Secondary | ICD-10-CM | POA: Diagnosis not present

## 2023-03-21 DIAGNOSIS — M25879 Other specified joint disorders, unspecified ankle and foot: Secondary | ICD-10-CM | POA: Diagnosis not present

## 2023-03-21 DIAGNOSIS — R262 Difficulty in walking, not elsewhere classified: Secondary | ICD-10-CM | POA: Diagnosis not present

## 2023-03-21 MED ORDER — METHYLPREDNISOLONE ACETATE 40 MG/ML IJ SUSP
40.0000 mg | INTRAMUSCULAR | Status: AC | PRN
  Administered 2023-03-21: 40 mg via INTRA_ARTICULAR

## 2023-03-21 MED ORDER — LIDOCAINE HCL 1 % IJ SOLN
3.0000 mL | INTRAMUSCULAR | Status: AC | PRN
  Administered 2023-03-21: 3 mL

## 2023-03-21 NOTE — ED Provider Triage Note (Signed)
Emergency Medicine Provider Triage Evaluation Note  Kathleen Douglas , a 73 y.o. female  was evaluated in triage.  Pt complains of left leg pain, mostly in her knee.  Pt worried about a dvt  Review of Systems  Positive: pain Negative: fever  Physical Exam  BP (!) 184/85   Pulse 78   Temp 98.1 F (36.7 C)   Resp 16   SpO2 99%  Gen:   Awake, no distress   Resp:  Normal effort  MSK:   Moves extremities without difficulty  Other:    Medical Decision Making  Medically screening exam initiated at 7:41 AM.  Appropriate orders placed.  Kathleen Douglas was informed that the remainder of the evaluation will be completed by another provider, this initial triage assessment does not replace that evaluation, and the importance of remaining in the ED until their evaluation is complete.     Elson Areas, [place] 03/21/23 [phone]

## 2023-03-21 NOTE — Discharge Instructions (Signed)
 You were seen in the emergency department for left knee pain Your ultrasound did not show any blood clot in your leg The x-ray did not show any broken bones or dislocation The most likely cause would be arthritis in the knee For this reason you should follow-up with Dr. Aniceto Kern orthopedic office who has seen you previously to discuss further management Return to the emergency room for severe pain if you are unable to walk or have any other concerns

## 2023-03-21 NOTE — Progress Notes (Addendum)
 Office Visit Note   Patient: Kathleen Douglas           Date of Birth: 03-11-1950           MRN: [id number] Visit Date: 03/21/2023              Requested by: Benjiman Bras, MD [phone] [address] [place],  [place] [address] PCP: Benjiman Bras, MD  Chief Complaint  Patient presents with   Left Knee - Pain      HPI: Kathleen Douglas is a pleasant 73 year old woman who has a history of left knee arthritis.  She has had injections a few years ago with Dr. Richardo Chandler.  She had increasing pain and swelling in her left knee.  Denies any injuries but has been working more and on her feet more.  Denies any fever or chills.  Did go to the ER with significant pain today.  Was evaluated for DVT which was negative.  Thought findings most consistent with her arthritis requesting an injection today patient was advised to follow-up immediately if worsening symptoms also told to keep ankle range of motion and given information about Voltaren gel  Assessment & Plan: Visit Diagnoses: Osteoarthritis left knee  Plan: Will go forward with injection today may follow-up as needed  Follow-Up Instructions: No follow-ups on file.   Ortho Exam  Patient is alert, oriented, no adenopathy, well-dressed, normal affect, normal respiratory effort. Examination of her left knee she has no erythema or effusion she does have some soft tissue swelling from the knee down to the ankle compartments are soft and nontender she has good range of motion without deficit..  She is neurovascular intact  Imaging: VAS US  LOWER EXTREMITY VENOUS (DVT) (ONLY [facility] & [facility]) Result Date: 03/21/2023  Lower Venous DVT Study Patient Name:  Kathleen Douglas  Date of Exam:   03/21/2023 Medical Rec #: [id number]            Accession #:    [id number] Date of Birth: 1950-10-23            Patient Gender: F Patient Douglas:   22 years Exam Location:  [facility] Procedure:      VAS US  LOWER EXTREMITY VENOUS (DVT) Referring Phys: LESLIE SOFIA  --------------------------------------------------------------------------------  Indications: Posterior left leg pain that radiates from the posterior knee up the posterior thigh and into the hip.  Risk Factors: History of Cortisone shot to the left knee some years ago. Comparison Study: Prior negative left lower extremity venous ultrasound done                   12/11/20 for similar symptoms at [facility] Imaging Performing Technologist: Carleene Chase RVS  Examination Guidelines: A complete evaluation includes B-mode imaging, spectral Doppler, color Doppler, and power Doppler as needed of all accessible portions of each vessel. Bilateral testing is considered an integral part of a complete examination. Limited examinations for reoccurring indications may be performed as noted. The reflux portion of the exam is performed with the patient in reverse Trendelenburg.  +-----+---------------+---------+-----------+----------+--------------+ RIGHTCompressibilityPhasicitySpontaneityPropertiesThrombus Aging +-----+---------------+---------+-----------+----------+--------------+ CFV  Full           Yes      Yes                                 +-----+---------------+---------+-----------+----------+--------------+   +---------+---------------+---------+-----------+----------+--------------+ LEFT     CompressibilityPhasicitySpontaneityPropertiesThrombus Aging +---------+---------------+---------+-----------+----------+--------------+ CFV  Full           Yes      Yes                                 +---------+---------------+---------+-----------+----------+--------------+ SFJ      Full                                                        +---------+---------------+---------+-----------+----------+--------------+ FV Prox  Full                                                        +---------+---------------+---------+-----------+----------+--------------+ FV Mid   Full                                                         +---------+---------------+---------+-----------+----------+--------------+ FV DistalFull                                                        +---------+---------------+---------+-----------+----------+--------------+ PFV      Full                                                        +---------+---------------+---------+-----------+----------+--------------+ POP      Full           Yes      Yes                                 +---------+---------------+---------+-----------+----------+--------------+ PTV      Full                                                        +---------+---------------+---------+-----------+----------+--------------+ PERO     Full                                                        +---------+---------------+---------+-----------+----------+--------------+ Gastroc  Full                                                        +---------+---------------+---------+-----------+----------+--------------+  Summary: RIGHT: - No evidence of common femoral vein obstruction.   LEFT: - There is no evidence of deep vein thrombosis in the lower extremity.  - No cystic structure found in the popliteal fossa.  *See table(s) above for measurements and observations. Electronically signed by Delaney Fearing on 03/21/2023 at 12:20:12 PM.    Final    DG Knee Complete 4 Views Left Result Date: 03/21/2023 CLINICAL DATA:  73 year old female with 3 days of lower extremity pain. Difficulty walking. EXAM: LEFT KNEE - COMPLETE 4+ VIEW COMPARISON:  Knee series 10/07/2021. FINDINGS: Lateral collateral ligament region dystrophic calcification which appears progressed since 2023. The adjacent lateral compartment joint space appears stable and maintained. Background bone mineralization is normal. On cross-table lateral view no joint effusion. Patella appears intact. Other joint spaces appear maintained. No acute osseous  abnormality identified. IMPRESSION: No acute osseous abnormality identified. 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 Electronically Signed   By: Marlise Simpers M.D.   On: 03/21/2023 08:23   No images are attached to the encounter.  Labs: Lab Results  Component Value Date   LABORGA NO GROWTH 12/30/2014     Lab Results  Component Value Date   ALBUMIN 4.5 02/23/2023   ALBUMIN 4.2 08/27/2021   ALBUMIN 4.5 03/26/2020    No results found for: "MG" Lab Results  Component Value Date   VD25OH 28.9 (L) 03/26/2020   VD25OH 26.9 (L) 08/01/2018   VD25OH 28.8 (L) 08/30/2017    No results found for: "PREALBUMIN"    Latest Ref Rng & Units 08/27/2021   10:20 AM 03/26/2020    9:50 AM 12/30/2014    1:11 PM  CBC EXTENDED  WBC 4.0 - 10.5 K/uL 3.9  3.6  3.7   RBC 3.87 - 5.11 Mil/uL 4.41  4.69  4.48   Hemoglobin 12.0 - 15.0 g/dL 96.2  95.2  84.1   HCT 36.0 - 46.0 % 38.3  40.4  38.4   Platelets 150.0 - 400.0 K/uL 177.0  194    NEUT# 1.4 - 7.7 K/uL 2.5     Lymph# 0.7 - 4.0 K/uL 1.0        There is no height or weight on file to calculate BMI.  Orders:  No orders of the defined types were placed in this encounter.  No orders of the defined types were placed in this encounter.    Procedures: Large Joint Inj: L knee on 03/21/2023 2:49 PM Indications: pain and diagnostic evaluation Details: 25 G 1.5 in needle, anteromedial approach  Arthrogram: No  Medications: 40 mg methylPREDNISolone  acetate 40 MG/ML; 3 mL lidocaine  1 % Outcome: tolerated well, no immediate complications Procedure, treatment alternatives, risks and benefits explained, specific risks discussed. Consent was given by the patient.    Clinical Data: No additional findings.  ROS:  All other systems negative, except as noted in the HPI. Review of Systems  Objective: Vital Signs: There were  no vitals taken for this visit.  Specialty Comments:  No specialty comments available.  PMFS History: Patient Active Problem List   Diagnosis Date Noted   Cough 10/31/2018   Dyslipidemia 03/03/2016   Vitamin D  deficiency 03/03/2016   Past Medical History:  Diagnosis Date   Cough 10/31/2018   HLD (hyperlipidemia)    HTN (hypertension)    Osteopenia    Seasonal allergies    Vitamin D  deficiency     Family History  Problem  Relation Douglas of Onset   Lymphoma Mother    Bladder Cancer Father    Hypertension Father    Glaucoma Father    Hypertension Sister    Colon cancer Maternal Grandmother 25   Diabetes Maternal Grandfather    Breast cancer Paternal Grandmother    Hypertension Daughter    Breast cancer Paternal Aunt        x 2   Esophageal cancer Neg Hx    Rectal cancer Neg Hx    Stomach cancer Neg Hx    Colon polyps Neg Hx     Past Surgical History:  Procedure Laterality Date   ABDOMINAL HYSTERECTOMY     BREAST BIOPSY Left    Excisional    COLONOSCOPY  2004   Dr.Martin Johnson   TUBAL LIGATION     Social History   Occupational History   Occupation: Client specialist  Tobacco Use   Smoking status: Never   Smokeless tobacco: Never  Vaping Use   Vaping status: Never Used  Substance and Sexual Activity   Alcohol use: Yes    Comment: socially, occ.   Drug use: No   Sexual activity: Yes    Birth control/protection: None

## 2023-03-21 NOTE — Progress Notes (Signed)
 VASCULAR LAB    Left lower extremity venous duplex has been performed.  See CV proc for preliminary results.  Messaged negative results to Dr. Ranelle Buys via secure chat  Carleene Chase, RVT 03/21/2023, 8:54 AM

## 2023-03-21 NOTE — ED Triage Notes (Signed)
 Pt. Stated, I've had left leg pain for the last 3 days to the point I can't even sleep or walk.

## 2023-03-21 NOTE — ED Provider Notes (Signed)
 [place] EMERGENCY DEPARTMENT AT [facility] Provider Note   CSN: [id number] Arrival date & time: 03/21/23  2130     History  Chief Complaint  Patient presents with   Leg Pain    Kathleen Douglas is a 73 y.o. female.  With a history of hypertension, hyperlipidemia and osteopenia presents to the ED for left lower extremity pain.  2 weeks of left lower extremity pain localized over the left knee.  No inciting trauma or injury.  Pain made worse with ambulation and weightbearing.  No fevers chills recent illness.  Patient has received steroid injections in the knee before which have helped with the sort of pain   Leg Pain      Home Medications Prior to Admission medications   Medication Sig Start Date End Date Taking? Authorizing Provider  Calcium  Carbonate-Vitamin D  (CALCIUM  + D PO) Take 2 tablets by mouth every evening. Reported on 06/05/2015    [provider]  chlorpheniramine-HYDROcodone  (TUSSIONEX) 10-8 MG/5ML Take 5 mLs by mouth every 12 (twelve) hours as needed for cough. 02/15/23   Webb, Padonda B, FNP  doxycycline  (VIBRA -TABS) 100 MG tablet Take 1 tablet (100 mg total) by mouth 2 (two) times daily. 02/15/23   Webb, Padonda B, FNP  methylPREDNISolone  (MEDROL  DOSEPAK) 4 MG TBPK tablet As directed 02/15/23   Webb, Padonda B, FNP  Multiple Vitamin (MULTIVITAMIN) tablet Take 1 tablet by mouth daily.    [provider]  valsartan  (DIOVAN ) 80 MG tablet Take 1 tablet (80 mg total) by mouth daily. 02/23/23   Benjiman Bras, MD  loratadine  (CLARITIN ) 10 MG tablet Take 1 tablet (10 mg total) by mouth daily. Patient not taking: No sig reported 06/05/18 01/06/20  Stallings, Zoe A, MD      Allergies    Patient has no known allergies.    Review of Systems   Review of Systems  Physical Exam Updated Vital Signs BP (!) 184/85   Pulse 78   Temp 98.1 F (36.7 C)   Resp 16   SpO2 99%  Physical Exam Vitals and nursing note reviewed.  HENT:     Head:  Normocephalic and atraumatic.  Eyes:     Pupils: Pupils are equal, round, and reactive to light.  Cardiovascular:     Rate and Rhythm: Normal rate and regular rhythm.  Pulmonary:     Effort: Pulmonary effort is normal.     Breath sounds: Normal breath sounds.  Abdominal:     Palpations: Abdomen is soft.     Tenderness: There is no abdominal tenderness.  Musculoskeletal:     Comments: Full active and passive range of motion of the left knee 2+ DP pulse bilaterally Sensation intact to light touch throughout Good motor strength with flexion at the hip and flexion extension of the knee Anterior knee slightly tender to palpation with mild overlying edema over the anterior medial aspect  Skin:    General: Skin is warm and dry.  Neurological:     Mental Status: She is alert.  Psychiatric:        Mood and Affect: Mood normal.     ED Results / Procedures / Treatments   Labs (all labs ordered are listed, but only abnormal results are displayed) Labs Reviewed - No data to display  EKG None  Radiology VAS US  LOWER EXTREMITY VENOUS (DVT) (ONLY [facility] & [facility]) Result Date: 03/21/2023  Lower Venous DVT Study Patient Name:  Kathleen Douglas  Date of Exam:  03/21/2023 Medical Rec #: [id number]            Accession #:    [id number] Date of Birth: Mar 27, 1950            Patient Gender: F Patient Age:   26 years Exam Location:  [facility] Procedure:      VAS US  LOWER EXTREMITY VENOUS (DVT) Referring Phys: LESLIE SOFIA --------------------------------------------------------------------------------  Indications: Posterior left leg pain that radiates from the posterior knee up the posterior thigh and into the hip.  Risk Factors: History of Cortisone shot to the left knee some years ago. Comparison Study: Prior negative left lower extremity venous ultrasound done                   12/11/20 for similar symptoms at [facility] Imaging Performing Technologist: Carleene Chase RVS  Examination Guidelines: A  complete evaluation includes B-mode imaging, spectral Doppler, color Doppler, and power Doppler as needed of all accessible portions of each vessel. Bilateral testing is considered an integral part of a complete examination. Limited examinations for reoccurring indications may be performed as noted. The reflux portion of the exam is performed with the patient in reverse Trendelenburg.  +-----+---------------+---------+-----------+----------+--------------+ RIGHTCompressibilityPhasicitySpontaneityPropertiesThrombus Aging +-----+---------------+---------+-----------+----------+--------------+ CFV  Full           Yes      Yes                                 +-----+---------------+---------+-----------+----------+--------------+   +---------+---------------+---------+-----------+----------+--------------+ LEFT     CompressibilityPhasicitySpontaneityPropertiesThrombus Aging +---------+---------------+---------+-----------+----------+--------------+ CFV      Full           Yes      Yes                                 +---------+---------------+---------+-----------+----------+--------------+ SFJ      Full                                                        +---------+---------------+---------+-----------+----------+--------------+ FV Prox  Full                                                        +---------+---------------+---------+-----------+----------+--------------+ FV Mid   Full                                                        +---------+---------------+---------+-----------+----------+--------------+ FV DistalFull                                                        +---------+---------------+---------+-----------+----------+--------------+ PFV      Full                                                        +---------+---------------+---------+-----------+----------+--------------+  POP      Full           Yes      Yes                                  +---------+---------------+---------+-----------+----------+--------------+ PTV      Full                                                        +---------+---------------+---------+-----------+----------+--------------+ PERO     Full                                                        +---------+---------------+---------+-----------+----------+--------------+ Gastroc  Full                                                        +---------+---------------+---------+-----------+----------+--------------+    Summary: RIGHT: - No evidence of common femoral vein obstruction.   LEFT: - There is no evidence of deep vein thrombosis in the lower extremity.  - No cystic structure found in the popliteal fossa.  *See table(s) above for measurements and observations.    Preliminary    DG Knee Complete 4 Views Left Result Date: 03/21/2023 CLINICAL DATA:  73 year old female with 3 days of lower extremity pain. Difficulty walking. EXAM: LEFT KNEE - COMPLETE 4+ VIEW COMPARISON:  Knee series 10/07/2021. FINDINGS: Lateral collateral ligament region dystrophic calcification which appears progressed since 2023. The adjacent lateral compartment joint space appears stable and maintained. Background bone mineralization is normal. On cross-table lateral view no joint effusion. Patella appears intact. Other joint spaces appear maintained. No acute osseous abnormality identified. IMPRESSION: No acute osseous abnormality identified. 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 Electronically Signed   By: Marlise Simpers M.D.   On: 03/21/2023 08:23    Procedures Procedures    Medications Ordered in ED Medications - No data to display  ED Course/ Medical Decision Making/ A&P                                 Medical Decision Making 73 year old female with history as  above presenting for atraumatic left knee pain.  Some mild edema on exam with anterior tenderness but full active range of motion.  No overlying redness or erythema.  No inciting injury.  Differential diagnosis includes osteoarthritis, left lower extremity DVT, osseous injury and infectious process.  Low suspicion for infectious process given range of motion and lack of erythema and warmth.  X-ray negative for acute osseous injury.  DVT negative on ultrasound of the left lower extremity.  Suspect this is most likely due to her osteoarthritis.  She has an orthopedic surgeon she will follow-up with.  Counseled her on symptomatic management with Tylenol  Motrin  as directed.  Stable for discharge  Final Clinical Impression(s) / ED Diagnoses Final diagnoses:  Acute pain of right knee    Rx / DC Orders ED Discharge Orders     None         Sallyanne Creamer, DO 03/21/23 1004

## 2023-03-22 ENCOUNTER — Telehealth: Payer: Self-pay | Admitting: Family Medicine

## 2023-03-22 NOTE — Telephone Encounter (Signed)
Patient Name First: Kathleen Last: Ethelene Douglas Gender: Female DOB: Oct 04, 1950 Age: 73 Y 5 M 12 D Return Phone Number: [phone] (Secondary) Address: City/ State/ Zip: [place]  [address] Client [facility] Night - C Client Site [facility] - Night Provider Meredith Staggers- MD Contact Type Call Who Is Calling Patient / Member / Family / Caregiver Call Type Triage / Clinical Caller Name [name] Relationship To Patient Spouse Return Phone Number [phone] (Secondary) Chief Complaint Leg Pain Reason for Call Symptomatic / Request for Health Information Initial Comment Caller states his wife has pain in leg. Translation No Nurse Assessment Nurse: Vedia Coffer, RN, Morrie Sheldon Date/Time (Eastern Time): 03/21/2023 6:01:18 AM Confirm and document reason for call. If symptomatic, describe symptoms. ---Caller states his wife has pain in left leg. Pain in front and back of knee and radiates down leg. Also some swelling noted Does the patient have any new or worsening symptoms? ---Yes Will a triage be completed? ---Yes Related visit to physician within the last 2 weeks? ---No Does the PT have any chronic conditions? (i.e. diabetes, asthma, this includes High risk factors for pregnancy, etc.) ---Yes List chronic conditions. ---HTN Is this a behavioral health or substance abuse call? ---No Guidelines Guideline Title Affirmed Question Affirmed Notes Nurse Date/Time Lamount Cohen Time) Leg Pain [1] Thigh or calf pain AND [2] only 1 side AND [3] present > 1 hour (Exception: Chronic unchanged pain.) Black, RN, Morrie Sheldon 03/21/2023 6:03:12 AM PLEASE NOTE: All timestamps contained within this report are represented as [place] Standard Time. CONFIDENTIALTY NOTICE: This fax transmission is intended only for the addressee. It contains information that is legally privileged, confidential or otherwise protected from use or disclosure. If  you are not the intended recipient, you are strictly prohibited from reviewing, disclosing, copying using or disseminating any of this information or taking any action in reliance on or regarding this information. If you have received this fax in error, please notify us immediately by telephone so that we can arrange for its return to [place]. Phone: [phone], Toll-Free: [phone], Fax: [phone] Page: 2 of 2 Call Id: [id number] Disp. Time Lamount Cohen Time) Disposition Final User 03/21/2023 6:09:47 AM See HCP within 4 Hours (or PCP triage) Yes Black, RN, Morrie Sheldon Final Disposition 03/21/2023 6:09:47 AM See HCP within 4 Hours (or PCP triage) Yes Black, RN, Merlinda Frederick Disagree/Comply Comply Caller Understands Yes PreDisposition InappropriateToAsk Care Advice Given Per Guideline SEE HCP (OR PCP TRIAGE) WITHIN 4 HOURS: * IF OFFICE WILL BE OPEN: You need to be seen within the next 3 or 4 hours. Call your doctor (or NP/PA) now or as soon as the office opens. CALL 911 IF: * Chest pain or shortness of breath occurs CALL BACK IF: * You become worse CARE ADVICE given per Leg Pain (Adult) guideline. Referrals GO TO FACILITY UNDECIDED  Called Pt she had a cortisone shot took the week off and is feeling much better. Advised her to call the office if she needed anything.

## 2023-04-12 ENCOUNTER — Ambulatory Visit (INDEPENDENT_AMBULATORY_CARE_PROVIDER_SITE_OTHER): Payer: Medicare Other | Admitting: *Deleted

## 2023-04-12 DIAGNOSIS — Z1231 Encounter for screening mammogram for malignant neoplasm of breast: Secondary | ICD-10-CM

## 2023-04-12 DIAGNOSIS — E2839 Other primary ovarian failure: Secondary | ICD-10-CM

## 2023-04-12 DIAGNOSIS — Z0000 Encounter for general adult medical examination without abnormal findings: Secondary | ICD-10-CM | POA: Diagnosis not present

## 2023-04-12 DIAGNOSIS — Z1211 Encounter for screening for malignant neoplasm of colon: Secondary | ICD-10-CM

## 2023-04-12 NOTE — Patient Instructions (Signed)
 Kathleen Douglas , Thank you for taking time to come for your Medicare Wellness Visit. I appreciate your ongoing commitment to your health goals. Please review the following plan we discussed and let me know if I can assist you in the future.   Screening recommendations/referrals: Colonoscopy: Education provided Mammogram: Education provided Bone Density: Education provided Recommended yearly ophthalmology/optometry visit for glaucoma screening and checkup Recommended yearly dental visit for hygiene and checkup  Vaccinations: Influenza vaccine: up to date Pneumococcal vaccine: up to date Tdap vaccine: Education provided Shingles vaccine: 1 of 2         Preventive Care 65 Years and Older, Female Preventive care refers to lifestyle choices and visits with your health care provider that can promote health and wellness. What does preventive care include? A yearly physical exam. This is also called an annual well check. Dental exams once or twice a year. Routine eye exams. Ask your health care provider how often you should have your eyes checked. Personal lifestyle choices, including: Daily care of your teeth and gums. Regular physical activity. Eating a healthy diet. Avoiding tobacco and drug use. Limiting alcohol use. Practicing safe sex. Taking low-dose aspirin every day. Taking vitamin and mineral supplements as recommended by your health care provider. What happens during an annual well check? The services and screenings done by your health care provider during your annual well check will depend on your age, overall health, lifestyle risk factors, and family history of disease. Counseling  Your health care provider may ask you questions about your: Alcohol use. Tobacco use. Drug use. Emotional well-being. Home and relationship well-being. Sexual activity. Eating habits. History of falls. Memory and ability to understand (cognition). Work and work Astronomer. Reproductive  health. Screening  You may have the following tests or measurements: Height, weight, and BMI. Blood pressure. Lipid and cholesterol levels. These may be checked every 5 years, or more frequently if you are over 99 years old. Skin check. Lung cancer screening. You may have this screening every year starting at age 68 if you have a 30-pack-year history of smoking and currently smoke or have quit within the past 15 years. Fecal occult blood test (FOBT) of the stool. You may have this test every year starting at age 44. Flexible sigmoidoscopy or colonoscopy. You may have a sigmoidoscopy every 5 years or a colonoscopy every 10 years starting at age 74. Hepatitis C blood test. Hepatitis B blood test. Sexually transmitted disease (STD) testing. Diabetes screening. This is done by checking your blood sugar (glucose) after you have not eaten for a while (fasting). You may have this done every 1-3 years. Bone density scan. This is done to screen for osteoporosis. You may have this done starting at age 21. Mammogram. This may be done every 1-2 years. Talk to your health care provider about how often you should have regular mammograms. Talk with your health care provider about your test results, treatment options, and if necessary, the need for more tests. Vaccines  Your health care provider may recommend certain vaccines, such as: Influenza vaccine. This is recommended every year. Tetanus, diphtheria, and acellular pertussis (Tdap, Td) vaccine. You may need a Td booster every 10 years. Zoster vaccine. You may need this after age 73. Pneumococcal 13-valent conjugate (PCV13) vaccine. One dose is recommended after age 27. Pneumococcal polysaccharide (PPSV23) vaccine. One dose is recommended after age 68. Talk to your health care provider about which screenings and vaccines you need and how often you need them. This information  is not intended to replace advice given to you by your health care provider.  Make sure you discuss any questions you have with your health care provider. Document Released: 02/21/2015 Document Revised: 10/15/2015 Document Reviewed: 11/26/2014 Elsevier Interactive Patient Education  2017 [employer].  Fall Prevention in the Home Falls can cause injuries. They can happen to people of all ages. There are many things you can do to make your home safe and to help prevent falls. What can I do on the outside of my home? Regularly fix the edges of walkways and driveways and fix any cracks. Remove anything that might make you trip as you walk through a door, such as a raised step or threshold. Trim any bushes or trees on the path to your home. Use bright outdoor lighting. Clear any walking paths of anything that might make someone trip, such as rocks or tools. Regularly check to see if handrails are loose or broken. Make sure that both sides of any steps have handrails. Any raised decks and porches should have guardrails on the edges. Have any leaves, snow, or ice cleared regularly. Use sand or salt on walking paths during winter. Clean up any spills in your garage right away. This includes oil or grease spills. What can I do in the bathroom? Use night lights. Install grab bars by the toilet and in the tub and shower. Do not use towel bars as grab bars. Use non-skid mats or decals in the tub or shower. If you need to sit down in the shower, use a plastic, non-slip stool. Keep the floor dry. Clean up any water that spills on the floor as soon as it happens. Remove soap buildup in the tub or shower regularly. Attach bath mats securely with double-sided non-slip rug tape. Do not have throw rugs and other things on the floor that can make you trip. What can I do in the bedroom? Use night lights. Make sure that you have a light by your bed that is easy to reach. Do not use any sheets or blankets that are too big for your bed. They should not hang down onto the floor. Have a  firm chair that has side arms. You can use this for support while you get dressed. Do not have throw rugs and other things on the floor that can make you trip. What can I do in the kitchen? Clean up any spills right away. Avoid walking on wet floors. Keep items that you use a lot in easy-to-reach places. If you need to reach something above you, use a strong step stool that has a grab bar. Keep electrical cords out of the way. Do not use floor polish or wax that makes floors slippery. If you must use wax, use non-skid floor wax. Do not have throw rugs and other things on the floor that can make you trip. What can I do with my stairs? Do not leave any items on the stairs. Make sure that there are handrails on both sides of the stairs and use them. Fix handrails that are broken or loose. Make sure that handrails are as long as the stairways. Check any carpeting to make sure that it is firmly attached to the stairs. Fix any carpet that is loose or worn. Avoid having throw rugs at the top or bottom of the stairs. If you do have throw rugs, attach them to the floor with carpet tape. Make sure that you have a light switch at the top of  the stairs and the bottom of the stairs. If you do not have them, ask someone to add them for you. What else can I do to help prevent falls? Wear shoes that: Do not have high heels. Have rubber bottoms. Are comfortable and fit you well. Are closed at the toe. Do not wear sandals. If you use a stepladder: Make sure that it is fully opened. Do not climb a closed stepladder. Make sure that both sides of the stepladder are locked into place. Ask someone to hold it for you, if possible. Clearly mark and make sure that you can see: Any grab bars or handrails. First and last steps. Where the edge of each step is. Use tools that help you move around (mobility aids) if they are needed. These include: Canes. Walkers. Scooters. Crutches. Turn on the lights when you  go into a dark area. Replace any light bulbs as soon as they burn out. Set up your furniture so you have a clear path. Avoid moving your furniture around. If any of your floors are uneven, fix them. If there are any pets around you, be aware of where they are. Review your medicines with your doctor. Some medicines can make you feel dizzy. This can increase your chance of falling. Ask your doctor what other things that you can do to help prevent falls. This information is not intended to replace advice given to you by your health care provider. Make sure you discuss any questions you have with your health care provider. Document Released: 11/21/2008 Document Revised: 07/03/2015 Document Reviewed: 03/01/2014 Elsevier Interactive Patient Education  2017 [employer].

## 2023-04-12 NOTE — Progress Notes (Signed)
 Subjective:   Kathleen Douglas is a 73 y.o. female who presents for Medicare Annual (Subsequent) preventive examination.  Visit Complete: Virtual I connected with  Kathleen Douglas on 04/12/23 by a audio enabled telemedicine application and verified that I am speaking with the correct person using two identifiers.  Patient Location: Home  Provider Location: Home Office  I discussed the limitations of evaluation and management by telemedicine. The patient expressed understanding and agreed to proceed.  Vital Signs: Because this visit was a virtual/telehealth visit, some criteria may be missing or patient reported. Any vitals not documented were not able to be obtained and vitals that have been documented are patient reported.   Cardiac Risk Factors include: advanced age (>38men, >22 women);hypertension     Objective:    There were no vitals filed for this visit. There is no height or weight on file to calculate BMI.     04/12/2023    8:57 AM 03/21/2023    7:11 AM 08/06/2021   12:06 PM 02/24/2021    1:12 PM 07/08/2020    1:51 PM 06/20/2020   10:00 AM 05/21/2019    8:07 AM  Advanced Directives  Does Patient Have a Medical Advance Directive? No No No No No No No  Would patient like information on creating a medical advance directive? No - Patient declined  No - Patient declined No - Patient declined No - Patient declined No - Patient declined Yes (ED - Information included in AVS)    Current Medications (verified) Outpatient Encounter Medications as of 04/12/2023  Medication Sig   Calcium Carbonate-Vitamin D (CALCIUM + D PO) Take 2 tablets by mouth every evening. Reported on 06/05/2015   chlorpheniramine-HYDROcodone (TUSSIONEX) 10-8 MG/5ML Take 5 mLs by mouth every 12 (twelve) hours as needed for cough.   doxycycline (VIBRA-TABS) 100 MG tablet Take 1 tablet (100 mg total) by mouth 2 (two) times daily.   methylPREDNISolone (MEDROL DOSEPAK) 4 MG TBPK tablet As directed   Multiple  Vitamin (MULTIVITAMIN) tablet Take 1 tablet by mouth daily.   valsartan (DIOVAN) 80 MG tablet Take 1 tablet (80 mg total) by mouth daily.   [DISCONTINUED] loratadine (CLARITIN) 10 MG tablet Take 1 tablet (10 mg total) by mouth daily. (Patient not taking: No sig reported)   No facility-administered encounter medications on file as of 04/12/2023.    Allergies (verified) Patient has no known allergies.   History: Past Medical History:  Diagnosis Date   Cough 10/31/2018   HLD (hyperlipidemia)    HTN (hypertension)    Osteopenia    Seasonal allergies    Vitamin D deficiency    Past Surgical History:  Procedure Laterality Date   ABDOMINAL HYSTERECTOMY     BREAST BIOPSY Left    Excisional    COLONOSCOPY  2004   Dr.Martin Johnson   TUBAL LIGATION     Family History  Problem Relation Age of Onset   Lymphoma Mother    Bladder Cancer Father    Hypertension Father    Glaucoma Father    Hypertension Sister    Colon cancer Maternal Grandmother 92   Diabetes Maternal Grandfather    Breast cancer Paternal Grandmother    Hypertension Daughter    Breast cancer Paternal Aunt        x 2   Esophageal cancer Neg Hx    Rectal cancer Neg Hx    Stomach cancer Neg Hx    Colon polyps Neg Hx    Social History  Socioeconomic History   Marital status: Married    Spouse name: Not on file   Number of children: 1   Years of education: Not on file   Highest education level: Not on file  Occupational History   Occupation: Client specialist  Tobacco Use   Smoking status: Never   Smokeless tobacco: Never  Vaping Use   Vaping status: Never Used  Substance and Sexual Activity   Alcohol use: Yes    Comment: socially, occ.   Drug use: No   Sexual activity: Yes    Birth control/protection: None  Other Topics Concern   Not on file  Social History Narrative   Not on file   Social Drivers of Health   Financial Resource Strain: Low Risk  (04/12/2023)   Overall Financial Resource Strain  (CARDIA)    Difficulty of Paying Living Expenses: Not hard at all  Food Insecurity: No Food Insecurity (04/12/2023)   Hunger Vital Sign    Worried About Running Out of Food in the Last Year: Never true    Ran Out of Food in the Last Year: Never true  Transportation Needs: No Transportation Needs (04/12/2023)   PRAPARE - Administrator, Civil Service (Medical): No    Lack of Transportation (Non-Medical): No  Physical Activity: Insufficiently Active (04/12/2023)   Exercise Vital Sign    Days of Exercise per Week: 4 days    Minutes of Exercise per Session: 30 min  Stress: No Stress Concern Present (04/12/2023)   [employer] of Occupational Health - Occupational Stress Questionnaire    Feeling of Stress : Not at all  Social Connections: Socially Integrated (04/12/2023)   Social Connection and Isolation Panel [NHANES]    Frequency of Communication with Friends and Family: More than three times a week    Frequency of Social Gatherings with Friends and Family: Three times a week    Attends Religious Services: More than 4 times per year    Active Member of Clubs or Organizations: Yes    Attends Banker Meetings: 1 to 4 times per year    Marital Status: Married    Tobacco Counseling Counseling given: Not Answered   Clinical Intake:  Pre-visit preparation completed: Yes  Pain : No/denies pain     Diabetes: No  How often do you need to have someone help you when you read instructions, pamphlets, or other written materials from your doctor or pharmacy?: 1 - Never  Interpreter Needed?: No  Information entered by :: Remi Haggard LPN   Activities of Daily Living    04/12/2023    9:22 AM  In your present state of health, do you have any difficulty performing the following activities:  Hearing? 0  Vision? 0  Difficulty concentrating or making decisions? 0  Walking or climbing stairs? 0  Dressing or bathing? 0  Doing errands, shopping? 0  Preparing Food and  eating ? N  Using the Toilet? N  In the past six months, have you accidently leaked urine? Y  Do you have problems with loss of bowel control? N  Managing your Medications? N  Managing your Finances? N  Housekeeping or managing your Housekeeping? N    Patient Care Team: Shade Flood, MD as PCP - General (Family Medicine)  Indicate any recent Medical Services you may have received from other than [facility] providers in the past year (date may be approximate).     Assessment:   This is a routine wellness examination  for Kathleen Douglas.  Hearing/Vision screen Hearing Screening - Comments:: No trouble hearing Vision Screening - Comments:: Up to date [employer] [address]   Goals Addressed             This Visit's Progress    Increase physical activity         Depression Screen    04/12/2023    9:00 AM 02/15/2023    9:21 AM 08/06/2021   12:13 PM 02/05/2021    1:37 PM 01/05/2021   11:09 AM 12/01/2020    2:44 PM 03/26/2020    8:27 AM  PHQ 2/9 Scores  PHQ - 2 Score 0 0 0 0 0 0 0  PHQ- 9 Score 0 0         Fall Risk    04/12/2023    8:56 AM 02/15/2023    9:21 AM 08/06/2021   12:06 PM 02/05/2021    1:36 PM 01/05/2021   11:09 AM  Fall Risk   Falls in the past year? 0 0 0 0 0  Number falls in past yr: 0 0 0 0 0  Injury with Fall? 0 0 0 0 0  Risk for fall due to :  No Fall Risks  No Fall Risks No Fall Risks  Follow up Falls evaluation completed;Education provided;Falls prevention discussed Falls evaluation completed Falls evaluation completed;Education provided;Falls prevention discussed Falls evaluation completed Falls evaluation completed    MEDICARE RISK AT HOME: Medicare Risk at Home Any stairs in or around the home?: Yes If so, are there any without handrails?: No Home free of loose throw rugs in walkways, pet beds, electrical cords, etc?: No Adequate lighting in your home to reduce risk of falls?: No Life alert?: No Use of a cane, walker or w/c?: No Grab bars in the  bathroom?: Yes Shower chair or bench in shower?: No Elevated toilet seat or a handicapped toilet?: Yes  TIMED UP AND GO:  Was the test performed?  No    Cognitive Function:        04/12/2023    8:58 AM 08/06/2021   12:08 PM 05/21/2019    8:08 AM  6CIT Screen  What Year? 0 points 0 points 0 points  What month? 0 points 0 points 0 points  What time? 0 points 0 points   Count back from 20 0 points 0 points 0 points  Months in reverse 0 points 0 points 0 points  Repeat phrase 0 points 0 points 0 points  Total Score 0 points 0 points     Immunizations Immunization History  Administered Date(s) Administered   Fluad Quad(high Dose 65+) 03/26/2020, 12/01/2020   Fluad Trivalent(High Dose 65+) 02/23/2023   Influenza Split 02/21/2012   Influenza, High Dose Seasonal PF 11/30/2017   Influenza,inj,Quad PF,6+ Mos 12/30/2014, 03/03/2016   PFIZER Comirnaty(Gray Top)Covid-19 Tri-Sucrose Vaccine 03/07/2019, 03/28/2019   PFIZER(Purple Top)SARS-COV-2 Vaccination 04/01/2020   PNEUMOCOCCAL CONJUGATE-20 02/23/2023   Pneumococcal Conjugate-13 02/18/2016   Pneumococcal Polysaccharide-23 02/21/2012   Tdap 09/22/2013    TDAP status: Up to date  Flu Vaccine status: Up to date  Pneumococcal vaccine status: Up to date  Covid-19 vaccine status: Information provided on how to obtain vaccines.   Qualifies for Shingles Vaccine? Yes   Zostavax completed No   Shingrix Completed?: No.    Education has been provided regarding the importance of this vaccine. Patient has been advised to call insurance company to determine out of pocket expense if they have not yet received this vaccine. Advised may also  receive vaccine at local pharmacy or Health Dept. Verbalized acceptance and understanding.  Screening Tests Health Maintenance  Topic Date Due   MAMMOGRAM  02/26/2022   COVID-19 Vaccine (4 - 2024-25 season) 10/10/2022   Zoster Vaccines- Shingrix (2 of 2) 05/29/2023   DTaP/Tdap/Td (2 - Td or Tdap)  09/23/2023   Medicare Annual Wellness (AWV)  04/11/2024   Pneumonia Vaccine 19+ Years old  Completed   INFLUENZA VACCINE  Completed   DEXA SCAN  Completed   Hepatitis C Screening  Completed   HPV VACCINES  Aged Out   Colonoscopy  Discontinued    Health Maintenance  Health Maintenance Due  Topic Date Due   MAMMOGRAM  02/26/2022   COVID-19 Vaccine (4 - 2024-25 season) 10/10/2022    Colorectal cancer screening: Referral to GI placed  . Pt aware the office will call re: appt.  Mammogram status: Ordered  . Pt provided with contact info and advised to call to schedule appt.   Bone Density status: Ordered  . Pt provided with contact info and advised to call to schedule appt.  Lung Cancer Screening: (Low Dose CT Chest recommended if Age 73-80 years, 20 pack-year currently smoking OR have quit w/in 15years.) does not qualify.   Lung Cancer Screening Referral:   Additional Screening:  Hepatitis C Screening: does not qualify; Completed 2018  Vision Screening: Recommended annual ophthalmology exams for early detection of glaucoma and other disorders of the eye. Is the patient up to date with their annual eye exam?  Yes  Who is the provider or what is the name of the office in which the patient attends annual eye exams? Unsure of name   If pt is not established with a provider, would they like to be referred to a provider to establish care? No .   Dental Screening: Recommended annual dental exams for proper oral hygiene    Community Resource Referral / Chronic Care Management: CRR required this visit?  No   CCM required this visit?  No     Plan:     I have personally reviewed and noted the following in the patient's chart:   Medical and social history Use of alcohol, tobacco or illicit drugs  Current medications and supplements including opioid prescriptions. Patient is not currently taking opioid prescriptions. Functional ability and status Nutritional status Physical  activity Advanced directives List of other physicians Hospitalizations, surgeries, and ER visits in previous 12 months Vitals Screenings to include cognitive, depression, and falls Referrals and appointments  In addition, I have reviewed and discussed with patient certain preventive protocols, quality metrics, and best practice recommendations. A written personalized care plan for preventive services as well as general preventive health recommendations were provided to patient.     Remi Haggard, LPN   10/15/2950   After Visit Summary: (MyChart) Due to this being a telephonic visit, the after visit summary with patients personalized plan was offered to patient via MyChart   Nurse Notes:

## 2023-06-28 ENCOUNTER — Ambulatory Visit
Admission: RE | Admit: 2023-06-28 | Discharge: 2023-06-28 | Disposition: A | Discharge status: Home / Self Care | Source: Ambulatory Visit | Attending: Family Medicine | Admitting: Family Medicine

## 2023-06-28 DIAGNOSIS — Z1231 Encounter for screening mammogram for malignant neoplasm of breast: Secondary | ICD-10-CM | POA: Diagnosis not present

## 2023-07-11 ENCOUNTER — Encounter: Payer: Self-pay | Admitting: Physician Assistant

## 2023-07-11 ENCOUNTER — Ambulatory Visit (INDEPENDENT_AMBULATORY_CARE_PROVIDER_SITE_OTHER): Admitting: Physician Assistant

## 2023-07-11 ENCOUNTER — Ambulatory Visit (INDEPENDENT_AMBULATORY_CARE_PROVIDER_SITE_OTHER)
Admission: RE | Admit: 2023-07-11 | Discharge: 2023-07-11 | Disposition: A | Discharge status: Home / Self Care | Source: Ambulatory Visit | Attending: Physician Assistant | Admitting: Physician Assistant

## 2023-07-11 ENCOUNTER — Ambulatory Visit: Payer: Self-pay | Admitting: Physician Assistant

## 2023-07-11 ENCOUNTER — Other Ambulatory Visit (INDEPENDENT_AMBULATORY_CARE_PROVIDER_SITE_OTHER)

## 2023-07-11 VITALS — BP 126/66 | HR 73 | Ht 62.0 in | Wt 157.0 lb

## 2023-07-11 DIAGNOSIS — Z800 Family history of malignant neoplasm of digestive organs: Secondary | ICD-10-CM | POA: Diagnosis not present

## 2023-07-11 DIAGNOSIS — R7989 Other specified abnormal findings of blood chemistry: Secondary | ICD-10-CM

## 2023-07-11 DIAGNOSIS — K625 Hemorrhage of anus and rectum: Secondary | ICD-10-CM

## 2023-07-11 DIAGNOSIS — R194 Change in bowel habit: Secondary | ICD-10-CM

## 2023-07-11 DIAGNOSIS — A09 Infectious gastroenteritis and colitis, unspecified: Secondary | ICD-10-CM | POA: Diagnosis not present

## 2023-07-11 DIAGNOSIS — R159 Full incontinence of feces: Secondary | ICD-10-CM

## 2023-07-11 DIAGNOSIS — Z860101 Personal history of adenomatous and serrated colon polyps: Secondary | ICD-10-CM | POA: Diagnosis not present

## 2023-07-11 DIAGNOSIS — K921 Melena: Secondary | ICD-10-CM

## 2023-07-11 DIAGNOSIS — I878 Other specified disorders of veins: Secondary | ICD-10-CM | POA: Diagnosis not present

## 2023-07-11 LAB — SEDIMENTATION RATE: Sed Rate: 23 mm/h (ref 0–30)

## 2023-07-11 LAB — CBC WITH DIFFERENTIAL/PLATELET
Basophils Absolute: 0 10*3/uL (ref 0.0–0.1)
Basophils Relative: 1 % (ref 0.0–3.0)
Eosinophils Absolute: 0 10*3/uL (ref 0.0–0.7)
Eosinophils Relative: 0.6 % (ref 0.0–5.0)
HCT: 39.6 % (ref 36.0–46.0)
Hemoglobin: 12.8 g/dL (ref 12.0–15.0)
Lymphocytes Relative: 29.6 % (ref 12.0–46.0)
Lymphs Abs: 1.3 10*3/uL (ref 0.7–4.0)
MCHC: 32.4 g/dL (ref 30.0–36.0)
MCV: 86.4 fl (ref 78.0–100.0)
Monocytes Absolute: 0.4 10*3/uL (ref 0.1–1.0)
Monocytes Relative: 9.5 % (ref 3.0–12.0)
Neutro Abs: 2.7 10*3/uL (ref 1.4–7.7)
Neutrophils Relative %: 59.3 % (ref 43.0–77.0)
Platelets: 202 10*3/uL (ref 150.0–400.0)
RBC: 4.58 Mil/uL (ref 3.87–5.11)
RDW: 14.9 % (ref 11.5–15.5)
WBC: 4.5 10*3/uL (ref 4.0–10.5)

## 2023-07-11 LAB — COMPREHENSIVE METABOLIC PANEL WITH GFR
ALT: 51 U/L — ABNORMAL HIGH (ref 0–35)
AST: 30 U/L (ref 0–37)
Albumin: 4.6 g/dL (ref 3.5–5.2)
Alkaline Phosphatase: 93 U/L (ref 39–117)
BUN: 22 mg/dL (ref 6–23)
CO2: 27 meq/L (ref 19–32)
Calcium: 10 mg/dL (ref 8.4–10.5)
Chloride: 104 meq/L (ref 96–112)
Creatinine, Ser: 0.64 mg/dL (ref 0.40–1.20)
GFR: 87.46 mL/min (ref >60.00)
Glucose, Bld: 80 mg/dL (ref 70–99)
Potassium: 4 meq/L (ref 3.5–5.1)
Sodium: 139 meq/L (ref 135–145)
Total Bilirubin: 0.7 mg/dL (ref 0.2–1.2)
Total Protein: 7.6 g/dL (ref 6.0–8.3)

## 2023-07-11 NOTE — Progress Notes (Signed)
 07/11/2023 Kathleen Douglas [id number] 1950/09/05  Referring provider: Benjiman Bras, MD Primary GI doctor: Dr. General Kenner ( Dr. 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  - will get KUB to evaluate for stool burden - consider linzess if it shows stool burden with pelvic floor - if it does not show stool burden, consider further work up for diarrhea and medication for diarrhea - with recent Doxy, will check Cdiff - given FODMAP - consider SIBO testing or empiric treatment - consider CT AB and pelvis  Rectal bleeding While visiting her daughter BRB on TP, with some itching Likely healing fissure, negative FOBT on exam   Patient Care Team: Benjiman Bras, MD as PCP - General (Family Medicine)  HISTORY OF PRESENT ILLNESS: 74 y.o. female with a past medical history of HTN, asthma, vit D def and others listed below presents for evaluation of rectal bleeding.   01/09/2018 colonoscopy for screening purposes with Dr. Sandrea Cruel 7 mm polyp sigmoid colon semipedunculated tubular adenoma, recall 5 years. (01/2023) 03/08/2023 Cardiac calcium  score 0 09/08/2021 colon due to FOBT +, change in Bm's completely normal, no recall due to age  Daughter had a child and has been with him in 20  Discussed the use of AI scribe software for clinical note transcription with the patient, who gave verbal consent to proceed.  History of Present Illness   Kathleen Douglas is a 73 year old female who presents with bowel irregularities and rectal bleeding.  She  experiences bowel irregularities characterized by alternating constipation and diarrhea. Bowel movements occur daily, typically early in the morning between 3 AM and 6 AM, often waking her up. Stools are usually loose, but she sometimes feels like she has hard fecal matter inside her. Multiple bowel movements occur in the morning and occasionally throughout the day, especially when at work.  She recently experienced rectal bleeding, noted on toilet paper, which she attributes to straining and wiping. She suspects hemorrhoids or possibly a fissure.  Her bowel symptoms seem to improve with increased physical activity and a lighter diet, as observed during a recent visit to her daughter's place. She was more physically active and consumed lighter meals, which she believes contributed to better bowel function.  No bloating, significant abdominal discomfort, or weight loss. No issues with swallowing, heartburn, nausea, fevers, chills, urinary incontinence, or painful intercourse are reported.  Her current medications include a multivitamin, calcium , and valsartan  for blood pressure. She was previously on doxycycline  in January for sinusitis or cough.  She is concerned about the impact of her bowel symptoms on her intimacy with her husband, fearing incontinence during intercourse.        Current Medications:   Current Outpatient Medications (Endocrine & Metabolic):    methylPREDNISolone  (MEDROL  DOSEPAK) 4 MG TBPK tablet, As directed (Patient not taking: Reported on 07/11/2023)  Current Outpatient Medications (Cardiovascular):    valsartan  (DIOVAN ) 80 MG tablet, Take 1 tablet (80 mg total) by mouth daily.  Current Outpatient Medications (Respiratory):    chlorpheniramine-HYDROcodone  (TUSSIONEX) 10-8 MG/5ML, Take 5 mLs by mouth  every 12 (twelve) hours as needed for cough. (Patient not taking: Reported on 07/11/2023)    Current Outpatient Medications (Other):    Calcium  Carbonate-Vitamin D  (CALCIUM  +  D PO), Take 2 tablets by mouth every evening. Reported on 06/05/2015   Multiple Vitamin (MULTIVITAMIN) tablet, Take 1 tablet by mouth daily.   doxycycline  (VIBRA -TABS) 100 MG tablet, Take 1 tablet (100 mg total) by mouth 2 (two) times daily. (Patient not taking: Reported on 07/11/2023)  Medical History:  Past Medical History:  Diagnosis Date   Cough 10/31/2018   HLD (hyperlipidemia)    HTN (hypertension)    Osteopenia    Seasonal allergies    Vitamin D  deficiency    Allergies: No Known Allergies   Surgical History:  She  has a past surgical history that includes Abdominal hysterectomy; Tubal ligation; Breast biopsy (Left); and Colonoscopy (2004). Family History:  Her family history includes Bladder Cancer in her father; Breast cancer in her paternal aunt and paternal grandmother; Colon cancer (age of onset: 53) in her maternal grandmother; Diabetes in her maternal grandfather; Glaucoma in her father; Hypertension in her daughter, father, and sister; Lymphoma in her mother. Social History:   reports that she has never smoked. She has never used smokeless tobacco. She reports current alcohol use. She reports that she does not use drugs.  REVIEW OF SYSTEMS  : All other systems reviewed and negative except where noted in the History of Present Illness.   PHYSICAL EXAM: BP 126/66   Pulse 73   Ht 5\' 2"  (1.575 m)   Wt 157 lb (71.2 kg)   BMI 28.72 kg/m  Physical Exam   GENERAL APPEARANCE: Well nourished, in no apparent distress. HEENT: No cervical lymphadenopathy, unremarkable thyroid , sclerae anicteric, conjunctiva pink. RESPIRATORY: Respiratory effort normal, breath sounds equal bilaterally without rales, rhonchi, or wheezing. CARDIO: Regular rate and rhythm with no murmurs, rubs, or gallops, peripheral pulses intact. ABDOMEN: Soft, non-distended, active bowel sounds in all four quadrants, no tenderness to palpation, no rebound, no mass appreciated. RECTAL: No anal fissure, evidence of  previous fissure in posterior. Rectal exam reveals soft stool, no masses, mucus present. Rectal tone slightly decreased. MUSCULOSKELETAL: Full range of motion, normal gait, without edema. SKIN: Dry, intact without rashes or lesions. No jaundice. NEURO: Alert, oriented, no focal deficits. PSYCH: Cooperative, normal mood and affect.         Edmonia Gottron, PA-C 2:24 PM

## 2023-07-11 NOTE — Patient Instructions (Addendum)
 Your provider has requested that you go to the basement level for lab work before leaving today. Press "B" on the elevator. The lab is located at the first door on the left as you exit the elevator.  Your provider has requested that you have an abdominal x ray before leaving today. Please go to the basement floor to our Radiology department for the test.    FODMAP stands for fermentable oligo-, di-, mono-saccharides and polyols (1). These are the scientific terms used to classify groups of carbs that are difficult for our body to digest and that are notorious for triggering digestive symptoms like bloating, gas, loose stools and stomach pain.   You can try low FODMAP diet  - start with eliminating just one column at a time that you feel may be a trigger for you. - the table at the very bottom contains foods that are low in FODMAPs   Sometimes trying to eliminate the FODMAP's from your diet is difficult or tricky, if you are stuggling with trying to do the elimination diet you can try an enzyme.  There is a food enzymes that you sprinkle in or on your food that helps break down the FODMAP. You can read more about the enzyme by going to this site: https://fodzyme.com/  Small intestinal bacterial overgrowth (SIBO) occurs when there is an abnormal increase in the overall bacterial population in the small intestine -- particularly types of bacteria not commonly found in that part of the digestive tract. Small intestinal bacterial overgrowth (SIBO) commonly results when a circumstance -- such as surgery or disease -- slows the passage of food and waste products in the digestive tract, creating a breeding ground for bacteria.  Signs and symptoms of SIBO often include: Loss of appetite Abdominal pain Nausea Bloating An uncomfortable feeling of fullness after eating Diarrhea or constipation, depending on the type of gas produced  What foods trigger SIBO? While foods aren't the original cause of  SIBO, certain foods do encourage the overgrowth of the wrong bacteria in your small intestine. If you're feeding them their favorite foods, they're going to grow more, and that will trigger more of your SIBO symptoms. By the same token, you can help reduce the overgrowth by starving the problematic bacteria of their favorite foods. This strategy has led to a number of proposed SIBO eating plans. The plans vary, and so do individual results. But in general, they tend to recommend limiting carbohydrates.  These include: Sugars and sweeteners. Fruits and starchy vegetables. Dairy products. Grains.  There is a test for this we can do called a breath test, if you are positive we will treat you with an antibiotic to see if it helps.  Your symptoms are very suspicious for this condition, as discussed, we will start you on an antibiotic to see if this helps.   I appreciate the opportunity to care for you. Santina Cull PA

## 2023-07-12 NOTE — Progress Notes (Signed)
 Agree with assessment and plan as outlined.

## 2023-07-14 ENCOUNTER — Telehealth: Payer: Self-pay | Admitting: Physician Assistant

## 2023-07-14 ENCOUNTER — Ambulatory Visit (HOSPITAL_COMMUNITY)
Admission: RE | Admit: 2023-07-14 | Discharge: 2023-07-14 | Disposition: A | Discharge status: Home / Self Care | Source: Ambulatory Visit | Attending: Physician Assistant | Admitting: Physician Assistant

## 2023-07-14 ENCOUNTER — Ambulatory Visit: Payer: Self-pay | Admitting: Physician Assistant

## 2023-07-14 DIAGNOSIS — R7989 Other specified abnormal findings of blood chemistry: Secondary | ICD-10-CM | POA: Insufficient documentation

## 2023-07-14 NOTE — Telephone Encounter (Signed)
 Negative Cdiff diatherix

## 2023-07-25 ENCOUNTER — Encounter: Payer: Self-pay | Admitting: Physician Assistant

## 2023-07-28 ENCOUNTER — Ambulatory Visit: Payer: Self-pay | Admitting: Family Medicine

## 2023-07-28 NOTE — Telephone Encounter (Signed)
 FYI Only or Action Required?: Action required by provider: clinical question for provider.  Patient was last seen in primary care on 02/23/2023 by Benjiman Bras, MD. Called Nurse Triage reporting Immunizations. Symptoms began yesterday. Interventions attempted: Nothing. Symptoms are: gradually improving.  Triage Disposition: Call PCP Within 24 Hours  Patient/caregiver understands and will follow disposition?: Yes             Copied from CRM [id number]. Topic: Clinical - Red Word Triage >> Jul 28, 2023  4:29 PM Sophia H wrote: Red Word that prompted transfer to Nurse Triage: Patient had her 2nd dose shingle shot at [facility] and now has rash/patch underneath the injection site, also is stating that she has been feeling very fatigued since Tuesday. Reason for Disposition  [1] Redness or red streak around the injection site AND [2] begins > 48 hours after shot AND [3] no fever  (Exception: Red area < 1 inch or 2.5 cm wide.)  Answer Assessment - Initial Assessment Questions Patient would like a call back from clinic at [phone] with recommendation.           1. SYMPTOMS: What is the main symptom? (e.g., redness, swelling, pain)      Fatigue, redness (has faded some now) 2. ONSET: When was the vaccine (shot) given? How much later did the fatigue begin? (e.g., hours, days ago)      Tuesday was given shingles vaccine, fatigue started yesterday, redness around injection site was noticed yesterday but pt  states it may have been there before 3. SEVERITY: How bad is it?      Redness has faded some 4. FEVER: Is there a fever? If Yes, ask: What is it, how was it measured, and when did it start?      Denies 5. IMMUNIZATIONS GIVEN: What shots have you recently received?     Shingles 6. PAST REACTIONS: Have you reacted to immunizations before? If Yes, ask: What happened?     No 7. OTHER SYMPTOMS: Do you have any other symptoms?     No  Protocols used:  Immunization Reactions-A-AH

## 2023-07-29 ENCOUNTER — Telehealth: Payer: Self-pay

## 2023-07-29 NOTE — Telephone Encounter (Signed)
 FYI pt is doing better today, has declined follow up appt.

## 2023-07-29 NOTE — Telephone Encounter (Signed)
 Copied from CRM [id number]. Topic: General - Other >> Jul 29, 2023  7:50 AM Alyse July wrote: Reason for CRM: Patient return call to inform provider that Swelling and redness has gone done after having a reaction to second shingle vaccine. Patient decline additional appt at this time

## 2023-07-29 NOTE — Telephone Encounter (Signed)
 Called patient to let her know we do have a few openings today for her to be seen for immunization reaction. Please advise if  other recommendations are needed.

## 2023-08-24 ENCOUNTER — Encounter: Payer: Self-pay | Admitting: Family Medicine

## 2023-08-24 ENCOUNTER — Ambulatory Visit (INDEPENDENT_AMBULATORY_CARE_PROVIDER_SITE_OTHER): Payer: Medicare Other | Admitting: Family Medicine

## 2023-08-24 VITALS — BP 110/70 | HR 65 | Temp 98.4°F | Ht 62.0 in | Wt 159.0 lb

## 2023-08-24 DIAGNOSIS — E785 Hyperlipidemia, unspecified: Secondary | ICD-10-CM | POA: Diagnosis not present

## 2023-08-24 DIAGNOSIS — I10 Essential (primary) hypertension: Secondary | ICD-10-CM | POA: Diagnosis not present

## 2023-08-24 DIAGNOSIS — R7989 Other specified abnormal findings of blood chemistry: Secondary | ICD-10-CM | POA: Diagnosis not present

## 2023-08-24 DIAGNOSIS — R051 Acute cough: Secondary | ICD-10-CM | POA: Diagnosis not present

## 2023-08-24 DIAGNOSIS — R0982 Postnasal drip: Secondary | ICD-10-CM | POA: Diagnosis not present

## 2023-08-24 MED ORDER — VALSARTAN 80 MG PO TABS: 80.0000 mg | ORAL_TABLET | Freq: Every day | ORAL | 2 refills | Status: DC

## 2023-08-24 MED ORDER — FLUTICASONE PROPIONATE 50 MCG/ACT NA SUSP: 1.0000 | Freq: Every day | NASAL | 6 refills | Status: DC

## 2023-08-24 NOTE — Progress Notes (Signed)
 Subjective:  Patient ID: Kathleen Douglas, female    DOB: 1950-03-12  Age: 73 y.o. MRN: [id number]  CC:  Chief Complaint  Patient presents with   Follow-up    No concerns    HPI Kathleen Douglas presents for  Chronic condition follow-up.  New grandchild 4 months ago.    Hypertension: Valsartan  80 mg daily. Also doing standing Pilates daily.  Home readings:not recent. No side effects.  BP Readings from Last 3 Encounters:  08/24/23 110/70  07/11/23 126/66  03/21/23 (!) 159/81   Lab Results  Component Value Date   CREATININE 0.64 07/11/2023   Hyperlipidemia: No current meds. 10 yr ascvd risk score 22% in January, but coronary calcium  scoring ), trivial descending thoracic aorta atherosclerosis.  Defers stain at this time.  Pilates as above, has incorporated some more walking and weights.  Would like to recheck labs in 6 months.  Recent CMP noted from GI.  Lab Results  Component Value Date   CHOL 270 (H) 02/23/2023   HDL 85.80 02/23/2023   LDLCALC 155 (H) 02/23/2023   TRIG 149.0 02/23/2023   CHOLHDL 3 02/23/2023   Lab Results  Component Value Date   ALT 51 (H) 07/11/2023   AST 30 07/11/2023   ALKPHOS 93 07/11/2023   BILITOT 0.7 07/11/2023    Cough Past few weeks. No fever. No dyspnea. Sometimes during day. Notices with lying down at night. No wheezing.   Still working in retail, around fabrics - notices clear phlegm, nasal congestion - more when at work. No recent treatments. Prior allergies, not recent.  No heartburn.   Elevated LFT.  She has been under the care of gastroenterology with constipation, with history of fecal incontinence. Single elevated ALT at 51, AST was normal on CMP.  ALT previously normal.  CBC normal, sed rate normal, abdominal x-ray with nonobstructive bowel gas pattern.  No sonographic etiology for elevated LFT identified on ultrasound 07/14/2023.  41-month monitoring recommended from GI. Bowel mvmts are improving.  HM:  Had shingrix at  [facility].  Bone density ordered at [facility] - advised to call.     History Patient Active Problem List   Diagnosis Date Noted   Cough 10/31/2018   Dyslipidemia 03/03/2016   Vitamin D  deficiency 03/03/2016   Past Medical History:  Diagnosis Date   Cough 10/31/2018   HLD (hyperlipidemia)    HTN (hypertension)    Osteopenia    Seasonal allergies    Vitamin D  deficiency    Past Surgical History:  Procedure Laterality Date   ABDOMINAL HYSTERECTOMY     BREAST BIOPSY Left    Excisional    COLONOSCOPY  2004   Dr.Martin Johnson   TUBAL LIGATION     No Known Allergies Prior to Admission medications   Medication Sig Start Date End Date Taking? Authorizing Provider  Calcium  Carbonate-Vitamin D  (CALCIUM  + D PO) Take 2 tablets by mouth every evening. Reported on 06/05/2015   Yes [provider]  Multiple Vitamin (MULTIVITAMIN) tablet Take 1 tablet by mouth daily.   Yes [provider]  valsartan  (DIOVAN ) 80 MG tablet Take 1 tablet (80 mg total) by mouth daily. 02/23/23  Yes Levora Reyes SAUNDERS, MD  chlorpheniramine-HYDROcodone  (TUSSIONEX) 10-8 MG/5ML Take 5 mLs by mouth every 12 (twelve) hours as needed for cough. Patient not taking: Reported on 07/11/2023 02/15/23   Webb, Padonda B, FNP  doxycycline  (VIBRA -TABS) 100 MG tablet Take 1 tablet (100 mg total) by mouth 2 (two) times daily. Patient  not taking: Reported on 07/11/2023 02/15/23   Douglass Caul B, FNP  methylPREDNISolone  (MEDROL  DOSEPAK) 4 MG TBPK tablet As directed Patient not taking: Reported on 07/11/2023 02/15/23   Webb, Padonda B, FNP  loratadine  (CLARITIN ) 10 MG tablet Take 1 tablet (10 mg total) by mouth daily. Patient not taking: No sig reported 06/05/18 01/06/20  Bettie Santana LABOR, MD   Social History   Socioeconomic History   Marital status: Married    Spouse name: Not on file   Number of children: 1   Years of education: Not on file   Highest education level: Bachelor's degree (e.g., BA, AB, BS)  Occupational History    Occupation: Client specialist  Tobacco Use   Smoking status: Never   Smokeless tobacco: Never  Vaping Use   Vaping status: Never Used  Substance and Sexual Activity   Alcohol use: Yes    Comment: socially, occ.   Drug use: No   Sexual activity: Yes    Birth control/protection: None  Other Topics Concern   Not on file  Social History Narrative   Not on file   Social Drivers of Health   Financial Resource Strain: Low Risk  (08/23/2023)   Overall Financial Resource Strain (CARDIA)    Difficulty of Paying Living Expenses: Not hard at all  Food Insecurity: No Food Insecurity (08/23/2023)   Hunger Vital Sign    Worried About Running Out of Food in the Last Year: Never true    Ran Out of Food in the Last Year: Never true  Transportation Needs: No Transportation Needs (08/23/2023)   PRAPARE - Administrator, Civil Service (Medical): No    Lack of Transportation (Non-Medical): No  Physical Activity: Sufficiently Active (08/23/2023)   Exercise Vital Sign    Days of Exercise per Week: 5 days    Minutes of Exercise per Session: 140 min  Stress: No Stress Concern Present (08/23/2023)   [employer] of Occupational Health - Occupational Stress Questionnaire    Feeling of Stress: Only a little  Social Connections: Socially Integrated (08/23/2023)   Social Connection and Isolation Panel    Frequency of Communication with Friends and Family: More than three times a week    Frequency of Social Gatherings with Friends and Family: Three times a week    Attends Religious Services: More than 4 times per year    Active Member of Clubs or Organizations: Yes    Attends Banker Meetings: 1 to 4 times per year    Marital Status: Married  Catering manager Violence: Not At Risk (04/12/2023)   Humiliation, Afraid, Rape, and Kick questionnaire    Fear of Current or Ex-Partner: No    Emotionally Abused: No    Physically Abused: No    Sexually Abused: No    Review of  Systems  Constitutional:  Negative for fatigue and unexpected weight change.  Respiratory:  Negative for chest tightness and shortness of breath.   Cardiovascular:  Negative for chest pain, palpitations and leg swelling.  Gastrointestinal:  Negative for abdominal pain and blood in stool.  Neurological:  Negative for dizziness, syncope, light-headedness and headaches.     Objective:   Vitals:   08/24/23 0932  BP: 110/70  Pulse: 65  Temp: 98.4 F (36.9 C)  SpO2: 98%  Weight: 159 lb (72.1 kg)  Height: 5' 2 (1.575 m)     Physical Exam Vitals reviewed.  Constitutional:      Appearance: Normal appearance. She is well-developed.  HENT:     Head: Normocephalic and atraumatic.  Eyes:     Conjunctiva/sclera: Conjunctivae normal.     Pupils: Pupils are equal, round, and reactive to light.  Neck:     Vascular: No carotid bruit.  Cardiovascular:     Rate and Rhythm: Normal rate and regular rhythm.     Heart sounds: Normal heart sounds.  Pulmonary:     Effort: Pulmonary effort is normal.     Breath sounds: Normal breath sounds.  Abdominal:     Palpations: Abdomen is soft. There is no pulsatile mass.     Tenderness: There is no abdominal tenderness.  Musculoskeletal:     Right lower leg: No edema.     Left lower leg: No edema.  Skin:    General: Skin is warm and dry.  Neurological:     Mental Status: She is alert and oriented to person, place, and time.  Psychiatric:        Mood and Affect: Mood normal.        Behavior: Behavior normal.        Assessment & Plan:  Raylin Mazikeen Hehn is a 73 y.o. female . Essential hypertension  - Stable with current regimen, continue valsartan  same dose, recent labs noted.  86-month follow-up.  Acute cough - Plan: fluticasone  (FLONASE ) 50 MCG/ACT nasal spray PND (post-nasal drip) - Plan: fluticasone  (FLONASE ) 50 MCG/ACT nasal spray  - Suspected postnasal drip as cause of cough, underlying allergies possible as noted more work.   Trial of Flonase  nasal spray initially versus antihistamine, correct use discussed with RTC precautions given.  Hyperlipidemia, unspecified hyperlipidemia type  - Elevated 10-year ASCVD risk score but reassuring coronary calcium  scoring.  Continue diet/exercise approach with recheck levels in 6 months.  LFT elevation  - Followed by gastroenterology with some improvement in bowel movements.  Plan for 33-month recheck of LFTs, can be performed with labs as above.  RTC precautions.  Meds ordered this encounter  Medications   fluticasone  (FLONASE ) 50 MCG/ACT nasal spray    Sig: Place 1-2 sprays into both nostrils daily.    Dispense:  16 g    Refill:  6   Patient Instructions  Blood pressure is stable, no med changes for now.  I do see the recent labs from gastroenterology, send no need blood work today.  We can recheck cholesterol at your next visit in 6 months, continue to exercise can be helpful and with the coronary calcium  scoring test reassuring I think it is reasonable to hold off on a statin medicine at this time.  Can recheck your liver test at follow-up visit in 6 months, continue follow-up with gastroenterology as planned.   Cough and congestion could be related to upper airway irritation from postnasal drip, allergies, congestion.  Try Flonase  nasal spray 1 to 2 sprays in each nostril once per day and follow-up if that is not effective.  Bone density test was ordered at [facility], should be able to call them and schedule but let me know if a new referral is needed.   Let me know if there are questions, and take care!    Signed,   Reyes Pines, MD [facility], [facility] 08/24/23 10:38 AM

## 2023-08-24 NOTE — Patient Instructions (Addendum)
 Blood pressure is stable, no med changes for now.  I do see the recent labs from gastroenterology, send no need blood work today.  We can recheck cholesterol at your next visit in 6 months, continue to exercise can be helpful and with the coronary calcium  scoring test reassuring I think it is reasonable to hold off on a statin medicine at this time.  Can recheck your liver test at follow-up visit in 6 months, continue follow-up with gastroenterology as planned.   Cough and congestion could be related to upper airway irritation from postnasal drip, allergies, congestion.  Try Flonase  nasal spray 1 to 2 sprays in each nostril once per day and follow-up if that is not effective.  Bone density test was ordered at [facility], should be able to call them and schedule but let me know if a new referral is needed.   Let me know if there are questions, and take care!

## 2023-09-12 ENCOUNTER — Ambulatory Visit: Admitting: Physician Assistant

## 2023-10-11 ENCOUNTER — Ambulatory Visit: Admitting: Physician Assistant

## 2023-10-11 NOTE — Progress Notes (Deleted)
 10/11/2023 Kathleen Douglas [id number] [birth year]  Referring provider: Levora Reyes SAUNDERS, MD Primary GI doctor: Dr. Leigh ( Dr. Aneita)  ASSESSMENT AND PLAN:  Personal history of TA polyps Grandmother with colon cancer in her 48's 09/08/2021 colon due to FOBT +, change in Bm's completely normal, no recall due to age, no specimen's collected Patient wants to be considered for follow up  Constipation with history fecal incontinence, feels she has BM every day, 3AM-6AM, will wake her up, can be loose or hard Had improvement with visiting daughter with activity and decreased food, no bloating.  - will get KUB to evaluate for stool burden - consider linzess if it shows stool burden with pelvic floor - if it does not show stool burden, consider further work up for diarrhea and medication for diarrhea - with recent Doxy, will check Cdiff - given FODMAP - consider SIBO testing or empiric treatment - consider CT AB and pelvis  Rectal bleeding While visiting her daughter BRB on TP, with some itching Likely healing fissure, negative FOBT on exam   Patient Care Team: Levora Reyes SAUNDERS, MD as PCP - General (Family Medicine)  HISTORY OF PRESENT ILLNESS: 73 y.o. female with a past medical history of HTN, asthma, vit D def and others listed below presents for evaluation of rectal bleeding.   01/09/2018 colonoscopy for screening purposes with Dr. Aneita 7 mm polyp sigmoid colon semipedunculated tubular adenoma, recall 5 years. (01/2023) 03/08/2023 Cardiac calcium  score 0 09/08/2021 colon due to FOBT +, change in Bm's completely normal, no recall due to age Patient last seen by myself 07/11/23 for change in bowel habits, CBC without infection or anemia sed rate negative, ALT slightly increased KUB shows stool burden throughout colon add on MiraLAX consider Linzess Negative C. Difficile RUQ US  unremarkable 07/14/2023  Discussed the use of AI scribe software for clinical note  transcription with the patient, who gave verbal consent to proceed.  History of Present Illness            Current Medications:    Current Outpatient Medications (Cardiovascular):    valsartan  (DIOVAN ) 80 MG tablet, Take 1 tablet (80 mg total) by mouth daily.  Current Outpatient Medications (Respiratory):    fluticasone  (FLONASE ) 50 MCG/ACT nasal spray, Place 1-2 sprays into both nostrils daily.    Current Outpatient Medications (Other):    Calcium  Carbonate-Vitamin D  (CALCIUM  + D PO), Take 2 tablets by mouth every evening. Reported on 06/05/2015   Multiple Vitamin (MULTIVITAMIN) tablet, Take 1 tablet by mouth daily.  Medical History:  Past Medical History:  Diagnosis Date   Cough 10/31/2018   HLD (hyperlipidemia)    HTN (hypertension)    Osteopenia    Seasonal allergies    Vitamin D  deficiency    Allergies: No Known Allergies   Surgical History:  She  has a past surgical history that includes Abdominal hysterectomy; Tubal ligation; Breast biopsy (Left); and Colonoscopy (2004). Family History:  Her family history includes Bladder Cancer in her father; Breast cancer in her paternal aunt and paternal grandmother; Colon cancer (age of onset: 28) in her maternal grandmother; Diabetes in her maternal grandfather; Glaucoma in her father; Hypertension in her daughter, father, and sister; Lymphoma in her mother. Social History:   reports that she has never smoked. She has never used smokeless tobacco. She reports current alcohol use. She reports that she does not use drugs.  REVIEW OF SYSTEMS  : All other systems reviewed and negative except where noted in  the History of Present Illness.   PHYSICAL EXAM: There were no vitals taken for this visit. Physical Exam             Alan JONELLE Coombs, PA-C 8:14 AM

## 2023-12-12 ENCOUNTER — Encounter: Payer: Self-pay | Admitting: Radiology

## 2024-02-22 ENCOUNTER — Ambulatory Visit (INDEPENDENT_AMBULATORY_CARE_PROVIDER_SITE_OTHER): Admitting: Family Medicine

## 2024-02-22 ENCOUNTER — Encounter: Payer: Self-pay | Admitting: Family Medicine

## 2024-02-22 VITALS — BP 110/80 | HR 67 | Temp 98.3°F | Resp 14 | Ht 62.0 in | Wt 161.0 lb

## 2024-02-22 DIAGNOSIS — E2839 Other primary ovarian failure: Secondary | ICD-10-CM | POA: Diagnosis not present

## 2024-02-22 DIAGNOSIS — Z23 Encounter for immunization: Secondary | ICD-10-CM

## 2024-02-22 DIAGNOSIS — E785 Hyperlipidemia, unspecified: Secondary | ICD-10-CM

## 2024-02-22 DIAGNOSIS — M8580 Other specified disorders of bone density and structure, unspecified site: Secondary | ICD-10-CM

## 2024-02-22 DIAGNOSIS — R7989 Other specified abnormal findings of blood chemistry: Secondary | ICD-10-CM | POA: Diagnosis not present

## 2024-02-22 DIAGNOSIS — I10 Essential (primary) hypertension: Secondary | ICD-10-CM

## 2024-02-22 LAB — COMPREHENSIVE METABOLIC PANEL WITH GFR
ALT: 20 U/L (ref 3–35)
AST: 23 U/L (ref 5–37)
Albumin: 4.2 g/dL (ref 3.5–5.2)
Alkaline Phosphatase: 85 U/L (ref 39–117)
BUN: 19 mg/dL (ref 6–23)
CO2: 30 meq/L (ref 19–32)
Calcium: 9.6 mg/dL (ref 8.4–10.5)
Chloride: 104 meq/L (ref 96–112)
Creatinine, Ser: 0.65 mg/dL (ref 0.40–1.20)
GFR: 86.76 mL/min
Glucose, Bld: 86 mg/dL (ref 70–99)
Potassium: 4.2 meq/L (ref 3.5–5.1)
Sodium: 140 meq/L (ref 135–145)
Total Bilirubin: 0.8 mg/dL (ref 0.2–1.2)
Total Protein: 6.9 g/dL (ref 6.0–8.3)

## 2024-02-22 LAB — LIPID PANEL
Cholesterol: 236 mg/dL — ABNORMAL HIGH (ref 28–200)
HDL: 98.6 mg/dL
LDL Cholesterol: 122 mg/dL — ABNORMAL HIGH (ref 10–99)
NonHDL: 137.43
Total CHOL/HDL Ratio: 2
Triglycerides: 75 mg/dL (ref 10.0–149.0)
VLDL: 15 mg/dL (ref 0.0–40.0)

## 2024-02-22 MED ORDER — VALSARTAN 80 MG PO TABS: 80.0000 mg | ORAL_TABLET | Freq: Every day | ORAL | 2 refills | Status: AC

## 2024-02-22 NOTE — Patient Instructions (Addendum)
 Thank you for coming in today. No change in medications at this time. If there are any concerns on your bloodwork, I will let you know.  I have placed a new order for bone density at [facility] imaging.  Let me know if you do not hear from them.  I will see you in 6 months.  Keep up the good work with positive health changes.  Take care!

## 2024-02-22 NOTE — Progress Notes (Signed)
 "  Subjective:  Patient ID: Kathleen Douglas, female    DOB: 08/07/1950  Age: 74 y.o. MRN: [id number]  CC:  Chief Complaint  Patient presents with   Follow-up    Did patient schedule bone density? Did she stop statin?   Patient did not schedule bone density. She had a lot going on with her grandson. She will schedule as soon as she can. She did not take her statin since last visit.     HPI Kathleen Douglas presents for follow up.  Enjoying spending time with grandbaby.   Hypertension: Valsartan  80 mg daily.  Exercise with Pilates prior.  No new side effects with medications. Less exercise, recently joined Weight Watchers.  Home readings: 110/80, 120/80.  BP Readings from Last 3 Encounters:  02/22/24 110/80  08/24/23 110/70  07/11/23 126/66   Lab Results  Component Value Date   CREATININE 0.64 07/11/2023   Hyperlipidemia: Discussed in July.  No meds at that time.  Trivial descending thoracic aorta atherosclerosis.  Plan for recheck labs in 6 months as she was incorporating more walking and weights.  Of note she has had elevated LFTs previously, by gastroenterology previously.  No sonographic etiologies for elevated LFTs identified last June.  8-month monitoring recommended from GI.on benefiber for bowel regularity.   CCS in 03/08/23. CCS 0.  2mm lung nodule, now risk, defers further eval at this time.  2 mm pleural based, noncalcified lingular pulmonary nodule. No follow-up needed if patient is low-risk.This recommendation follows the consensus statement: Guidelines for Management of Incidental Pulmonary Nodules Detected on CT Images: From the Fleischner Society 2017; Radiology 2017; 284:228-243.  Lab Results  Component Value Date   CHOL 270 (H) 02/23/2023   HDL 85.80 02/23/2023   LDLCALC 155 (H) 02/23/2023   TRIG 149.0 02/23/2023   CHOLHDL 3 02/23/2023   Lab Results  Component Value Date   ALT 51 (H) 07/11/2023   AST 30 07/11/2023   ALKPHOS 93 07/11/2023    BILITOT 0.7 07/11/2023   Health maintenance Has not yet scheduled bone density but intends on doing so. Busy with grandchild.  COVID-19 booster - defers.  Had RSV vaccine at pharmacy.  Flu vaccine today Had 2nd shingrix at [facility]. UTD on tetanus.   History Patient Active Problem List   Diagnosis Date Noted   Cough 10/31/2018   Dyslipidemia 03/03/2016   Vitamin D  deficiency 03/03/2016   Past Medical History:  Diagnosis Date   Cough 10/31/2018   HLD (hyperlipidemia)    HTN (hypertension)    Osteopenia    Seasonal allergies    Vitamin D  deficiency    Past Surgical History:  Procedure Laterality Date   ABDOMINAL HYSTERECTOMY     BREAST BIOPSY Left    Excisional    COLONOSCOPY  2004   Dr.Martin Johnson   TUBAL LIGATION     Allergies[1] Prior to Admission medications  Medication Sig Start Date End Date Taking? Authorizing Provider  Calcium  Carbonate-Vitamin D  (CALCIUM  + D PO) Take 2 tablets by mouth every evening. Reported on 06/05/2015   Yes [provider]  Multiple Vitamin (MULTIVITAMIN) tablet Take 1 tablet by mouth daily.   Yes [provider]  valsartan  (DIOVAN ) 80 MG tablet Take 1 tablet (80 mg total) by mouth daily. 08/24/23  Yes Levora Reyes SAUNDERS, MD  fluticasone  (FLONASE ) 50 MCG/ACT nasal spray Place 1-2 sprays into both nostrils daily. Patient not taking: Reported on 02/22/2024 08/24/23   Levora Reyes SAUNDERS, MD  loratadine  (CLARITIN ) 10  MG tablet Take 1 tablet (10 mg total) by mouth daily. Patient not taking: No sig reported 06/05/18 01/06/20  Bettie Santana LABOR, MD   Social History   Socioeconomic History   Marital status: Married    Spouse name: Not on file   Number of children: 1   Years of education: Not on file   Highest education level: Bachelor's degree (e.g., BA, AB, BS)  Occupational History   Occupation: Client specialist  Tobacco Use   Smoking status: Never   Smokeless tobacco: Never  Vaping Use   Vaping status: Never Used  Substance  and Sexual Activity   Alcohol use: Yes    Comment: socially, occ.   Drug use: No   Sexual activity: Yes    Birth control/protection: None  Other Topics Concern   Not on file  Social History Narrative   Not on file   Social Drivers of Health   Tobacco Use: Low Risk (02/22/2024)   Patient History    Smoking Tobacco Use: Never    Smokeless Tobacco Use: Never    Passive Exposure: Not on file  Financial Resource Strain: Low Risk (02/21/2024)   Overall Financial Resource Strain (CARDIA)    Difficulty of Paying Living Expenses: Not hard at all  Food Insecurity: No Food Insecurity (02/21/2024)   Epic    Worried About Programme Researcher, Broadcasting/film/video in the Last Year: Never true    Ran Out of Food in the Last Year: Never true  Transportation Needs: No Transportation Needs (02/21/2024)   Epic    Lack of Transportation (Medical): No    Lack of Transportation (Non-Medical): No  Physical Activity: Sufficiently Active (08/23/2023)   Exercise Vital Sign    Days of Exercise per Week: 5 days    Minutes of Exercise per Session: 140 min  Stress: No Stress Concern Present (02/21/2024)   [employer] of Occupational Health - Occupational Stress Questionnaire    Feeling of Stress: Not at all  Social Connections: Socially Integrated (02/21/2024)   Social Connection and Isolation Panel    Frequency of Communication with Friends and Family: More than three times a week    Frequency of Social Gatherings with Friends and Family: Twice a week    Attends Religious Services: More than 4 times per year    Active Member of [employer] or Organizations: Yes    Attends Banker Meetings: 1 to 4 times per year    Marital Status: Married  Catering Manager Violence: Not At Risk (04/12/2023)   Humiliation, Afraid, Rape, and Kick questionnaire    Fear of Current or Ex-Partner: No    Emotionally Abused: No    Physically Abused: No    Sexually Abused: No  Depression (PHQ2-9): Low Risk (02/22/2024)   Depression  (PHQ2-9)    PHQ-2 Score: 0  Alcohol Screen: Low Risk (02/21/2024)   Alcohol Screen    Last Alcohol Screening Score (AUDIT): 3  Housing: Low Risk (02/21/2024)   Epic    Unable to Pay for Housing in the Last Year: No    Number of Times Moved in the Last Year: 0    Homeless in the Last Year: No  Utilities: Not At Risk (04/12/2023)   [employer] Utilities    Threatened with loss of utilities: No  Health Literacy: Adequate Health Literacy (04/12/2023)   B1300 Health Literacy    Frequency of need for help with medical instructions: Never    Review of Systems  Constitutional:  Negative for fatigue and  unexpected weight change.  Respiratory:  Negative for chest tightness and shortness of breath.   Cardiovascular:  Negative for chest pain, palpitations and leg swelling.  Gastrointestinal:  Negative for abdominal pain and blood in stool.  Neurological:  Negative for dizziness, syncope, light-headedness and headaches.     Objective:   Vitals:   02/22/24 0849  BP: 110/80  Pulse: 67  Resp: 14  Temp: 98.3 F (36.8 C)  TempSrc: Temporal  SpO2: 97%  Weight: 161 lb (73 kg)  Height: 5' 2 (1.575 m)     Physical Exam Vitals reviewed.  Constitutional:      Appearance: Normal appearance. She is well-developed.  HENT:     Head: Normocephalic and atraumatic.  Eyes:     Conjunctiva/sclera: Conjunctivae normal.     Pupils: Pupils are equal, round, and reactive to light.  Neck:     Vascular: No carotid bruit.  Cardiovascular:     Rate and Rhythm: Normal rate and regular rhythm.     Heart sounds: Normal heart sounds.  Pulmonary:     Effort: Pulmonary effort is normal.     Breath sounds: Normal breath sounds.  Abdominal:     Palpations: Abdomen is soft. There is no pulsatile mass.     Tenderness: There is no abdominal tenderness.  Musculoskeletal:     Right lower leg: No edema.     Left lower leg: No edema.  Skin:    General: Skin is warm and dry.  Neurological:     Mental Status: She is  alert and oriented to person, place, and time.  Psychiatric:        Mood and Affect: Mood normal.        Behavior: Behavior normal.        Assessment & Plan:  Aubrianne Zakyah Yanes is a 74 y.o. female . Essential hypertension - Plan: Comprehensive metabolic panel with GFR, valsartan  (DIOVAN ) 80 MG tablet  -  Stable, tolerating current regimen. Medications refilled. Labs pending as above.   Need for influenza vaccination - Plan: Flu vaccine HIGH DOSE PF(Fluzone Trivalent)  LFT elevation - Plan: Comprehensive metabolic panel with GFR  - Mild elevation previously with reassuring ultrasound, check updated LFTs, then consider GI follow-up.  Hyperlipidemia, unspecified hyperlipidemia type - Plan: Lipid panel  - With coronary calcium  score of 0.  Has started weight watchers, anticipate weight improvement and potentially improvement in cholesterol with that approach but will check baseline labs today.  Osteopenia, unspecified location - Plan: DG Bone Density Estrogen deficiency - Plan: DG Bone Density  - Osteopenia noted on prior imaging in 2018, new order placed to have that performed at [facility] at her request  Meds ordered this encounter  Medications   valsartan  (DIOVAN ) 80 MG tablet    Sig: Take 1 tablet (80 mg total) by mouth daily.    Dispense:  90 tablet    Refill:  2   Patient Instructions  Thank you for coming in today. No change in medications at this time. If there are any concerns on your bloodwork, I will let you know.  I have placed a new order for bone density at [facility] imaging.  Let me know if you do not hear from them.  I will see you in 6 months.  Keep up the good work with positive health changes.  Take care!       Signed,   Reyes Pines, MD [facility]  Group 02/22/2024 9:16 AM      [1] No Known Allergies  "

## 2024-02-26 ENCOUNTER — Ambulatory Visit: Payer: Self-pay | Admitting: Family Medicine

## 2024-04-17 ENCOUNTER — Encounter

## 2024-08-24 ENCOUNTER — Ambulatory Visit: Admitting: Family Medicine
